# Patient Record
Sex: Male | Born: 1950
Health system: Southern US, Community
[De-identification: ages and names within clinical notes are randomized; demographics above are authoritative.]

## PROBLEM LIST (undated history)

## (undated) DIAGNOSIS — M65949 Unspecified synovitis and tenosynovitis, unspecified hand: Secondary | ICD-10-CM

## (undated) DIAGNOSIS — I499 Cardiac arrhythmia, unspecified: Secondary | ICD-10-CM

## (undated) DIAGNOSIS — F319 Bipolar disorder, unspecified: Secondary | ICD-10-CM

## (undated) DIAGNOSIS — R55 Syncope and collapse: Secondary | ICD-10-CM

## (undated) DIAGNOSIS — N400 Enlarged prostate without lower urinary tract symptoms: Secondary | ICD-10-CM

## (undated) DIAGNOSIS — M1990 Unspecified osteoarthritis, unspecified site: Secondary | ICD-10-CM

## (undated) DIAGNOSIS — F32A Depression, unspecified: Secondary | ICD-10-CM

## (undated) DIAGNOSIS — F419 Anxiety disorder, unspecified: Secondary | ICD-10-CM

## (undated) DIAGNOSIS — I480 Paroxysmal atrial fibrillation: Secondary | ICD-10-CM

## (undated) DIAGNOSIS — K219 Gastro-esophageal reflux disease without esophagitis: Secondary | ICD-10-CM

## (undated) DIAGNOSIS — E785 Hyperlipidemia, unspecified: Secondary | ICD-10-CM

## (undated) DIAGNOSIS — F329 Major depressive disorder, single episode, unspecified: Secondary | ICD-10-CM

## (undated) DIAGNOSIS — I739 Peripheral vascular disease, unspecified: Secondary | ICD-10-CM

## (undated) DIAGNOSIS — M659 Synovitis and tenosynovitis, unspecified: Secondary | ICD-10-CM

## (undated) HISTORY — PX: VASECTOMY: SHX75

## (undated) HISTORY — PX: TONSILLECTOMY: SUR1361

---
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
## 2014-07-09 ENCOUNTER — Emergency Department (HOSPITAL_COMMUNITY): Payer: 59

## 2014-07-09 ENCOUNTER — Encounter (HOSPITAL_COMMUNITY): Payer: Self-pay | Admitting: Neurology

## 2014-07-09 ENCOUNTER — Emergency Department (HOSPITAL_COMMUNITY)
Admission: EM | Admit: 2014-07-09 | Discharge: 2014-07-12 | Disposition: A | Discharge status: Other Acute Inpatient Hospital | Payer: 59 | Attending: Emergency Medicine | Admitting: Emergency Medicine

## 2014-07-09 DIAGNOSIS — F1310 Sedative, hypnotic or anxiolytic abuse, uncomplicated: Secondary | ICD-10-CM | POA: Insufficient documentation

## 2014-07-09 DIAGNOSIS — F32A Depression, unspecified: Secondary | ICD-10-CM

## 2014-07-09 DIAGNOSIS — F319 Bipolar disorder, unspecified: Secondary | ICD-10-CM | POA: Insufficient documentation

## 2014-07-09 DIAGNOSIS — N400 Enlarged prostate without lower urinary tract symptoms: Secondary | ICD-10-CM | POA: Insufficient documentation

## 2014-07-09 DIAGNOSIS — Z8739 Personal history of other diseases of the musculoskeletal system and connective tissue: Secondary | ICD-10-CM | POA: Diagnosis not present

## 2014-07-09 DIAGNOSIS — F329 Major depressive disorder, single episode, unspecified: Secondary | ICD-10-CM | POA: Diagnosis not present

## 2014-07-09 DIAGNOSIS — Z79899 Other long term (current) drug therapy: Secondary | ICD-10-CM | POA: Diagnosis not present

## 2014-07-09 DIAGNOSIS — R443 Hallucinations, unspecified: Secondary | ICD-10-CM | POA: Diagnosis not present

## 2014-07-09 DIAGNOSIS — F22 Delusional disorders: Secondary | ICD-10-CM | POA: Diagnosis not present

## 2014-07-09 DIAGNOSIS — R45851 Suicidal ideations: Secondary | ICD-10-CM | POA: Diagnosis not present

## 2014-07-09 HISTORY — DX: Bipolar disorder, unspecified: F31.9

## 2014-07-09 HISTORY — DX: Anxiety disorder, unspecified: F41.9

## 2014-07-09 HISTORY — DX: Depression, unspecified: F32.A

## 2014-07-09 HISTORY — DX: Major depressive disorder, single episode, unspecified: F32.9

## 2014-07-09 HISTORY — DX: Benign prostatic hyperplasia without lower urinary tract symptoms: N40.0

## 2014-07-09 HISTORY — DX: Unspecified osteoarthritis, unspecified site: M19.90

## 2014-07-09 LAB — COMPREHENSIVE METABOLIC PANEL
ALBUMIN: 4.5 g/dL (ref 3.5–5.2)
ALT: 26 U/L (ref 0–53)
AST: 31 U/L (ref 0–37)
Alkaline Phosphatase: 93 U/L (ref 39–117)
Anion gap: 6 (ref 5–15)
BUN: 15 mg/dL (ref 6–23)
CALCIUM: 10.1 mg/dL (ref 8.4–10.5)
CO2: 31 mmol/L (ref 19–32)
CREATININE: 1.23 mg/dL (ref 0.50–1.35)
Chloride: 96 mmol/L (ref 96–112)
GFR calc Af Amer: 70 mL/min — ABNORMAL LOW (ref >90)
GFR calc non Af Amer: 60 mL/min — ABNORMAL LOW (ref >90)
Glucose, Bld: 99 mg/dL (ref 70–99)
Potassium: 3.7 mmol/L (ref 3.5–5.1)
Sodium: 133 mmol/L — ABNORMAL LOW (ref 135–145)
TOTAL PROTEIN: 7.5 g/dL (ref 6.0–8.3)
Total Bilirubin: 0.8 mg/dL (ref 0.3–1.2)

## 2014-07-09 LAB — ETHANOL

## 2014-07-09 LAB — CBC
HCT: 46 % (ref 39.0–52.0)
Hemoglobin: 16.5 g/dL (ref 13.0–17.0)
MCH: 29.4 pg (ref 26.0–34.0)
MCHC: 35.9 g/dL (ref 30.0–36.0)
MCV: 82 fL (ref 78.0–100.0)
Platelets: 285 10*3/uL (ref 150–400)
RBC: 5.61 MIL/uL (ref 4.22–5.81)
RDW: 12.6 % (ref 11.5–15.5)
WBC: 8.3 10*3/uL (ref 4.0–10.5)

## 2014-07-09 LAB — SALICYLATE LEVEL: Salicylate Lvl: <4 mg/dL (ref 2.8–20.0)

## 2014-07-09 LAB — ACETAMINOPHEN LEVEL

## 2014-07-09 LAB — VALPROIC ACID LEVEL

## 2014-07-09 NOTE — ED Notes (Signed)
Pt reports depression due to financial reasons. Denies SI or HI. Pt appears sad, withdrawn, speaking softly and staring off while avoiding eye contact. Wife at bedside with patient. She reports last year he was dx with bipolar and spent 5 days at [facility]. Past few days hasn't been sleeping. Pt reports he is hearing voices. Wife reports the voices told him "I'm gonna take you down this time".

## 2014-07-09 NOTE — ED Notes (Signed)
Pt refuses to talk, pt laying in bed staring off into distance, while questioning patient if he is having SI he only replies with "depressed" and then the word "agony" Pt refuses to talk at this time, offered meal, pt refuses to eat.

## 2014-07-09 NOTE — ED Notes (Signed)
Attempted to in and out cath pt, pt diaper wet, no urine back.

## 2014-07-09 NOTE — ED Notes (Signed)
Pt called with no answer

## 2014-07-09 NOTE — ED Provider Notes (Signed)
CSN: [id number]     Arrival date & time 07/09/14  1736 History   None    Chief Complaint  Patient presents with  . Depression     (Consider location/radiation/quality/duration/timing/severity/associated sxs/prior Treatment) The history is provided by the patient. No language interpreter was used.  Juan Shepard is a 64 y/o M with PMhx of bipolar disorder, arthritis, BPH, depression presenting to the ED with increased depression. Patient was brought in by wife regarding increased depression over the past couple of days. Patient reported that he has been feeling sad. When this provider tried to have an interview with the patient, patient would not respond. Patient refuses to talk. When asked single yes or no questions, patient response. Patient reported that he has history of depression, reported that his mother has history of depression. When asked if he is hearing voices or seeing things patient responds "yes" but does not go any further description. Reported that he has had decrease in appetite and poor sleeping. When asked if he is suicidal homicidal patient does not answer. Stated that he started Seroquel 4 days ago. Denied marijuana, heroine, cocaine, alcohol abuse. Denied headache, dizziness, fever, chills, sore throat, difficulty swallowing, pain with swallowing, chest pain, shortness of breath, difficulty breathing, weakness, numbness, tingling, abdominal pain, nausea, vomiting, diarrhea, blood in the stools, black tarry stools, difficulty with urinating, leg weakness, leg pain, fall, injury, back pain, neck pain, neck stiffness. PCP none  Past Medical History  Diagnosis Date  . Bipolar disorder   . Arthritis   . BPH (benign prostatic hyperplasia)   . Depression    History reviewed. No pertinent past surgical history. No family history on file. History  Substance Use Topics  . Smoking status: Never Smoker   . Smokeless tobacco: Not on file  . Alcohol Use: No    Review of  Systems  Constitutional: Negative for fever and chills.  Eyes: Negative for visual disturbance.  Respiratory: Negative for chest tightness and shortness of breath.   Cardiovascular: Negative for chest pain.  Gastrointestinal: Negative for nausea, vomiting, abdominal pain, diarrhea, constipation, blood in stool and anal bleeding.  Genitourinary: Negative for dysuria and decreased urine volume.  Musculoskeletal: Negative for back pain, neck pain and neck stiffness.  Neurological: Negative for dizziness, weakness, numbness and headaches.  Psychiatric/Behavioral: Positive for hallucinations. Negative for suicidal ideas.      Allergies  Review of patient's allergies indicates no known allergies.  Home Medications   Prior to Admission medications   Medication Sig Start Date End Date Taking? Authorizing Provider  divalproex (DEPAKOTE ER) 500 MG 24 hr tablet Take 500 mg by mouth at bedtime.   Yes Historical Provider, MD  finasteride (PROSCAR) 5 MG tablet Take 5 mg by mouth daily.   Yes Historical Provider, MD  QUEtiapine (SEROQUEL) 100 MG tablet Take 400 mg by mouth at bedtime.   Yes Historical Provider, MD  sertraline (ZOLOFT) 50 MG tablet Take 50 mg by mouth every morning.   Yes Historical Provider, MD  tamsulosin (FLOMAX) 0.4 MG CAPS capsule Take 0.4 mg by mouth daily after supper.   Yes Historical Provider, MD  temazepam (RESTORIL) 15 MG capsule Take 15 mg by mouth at bedtime as needed for sleep.   Yes Historical Provider, MD   BP 170/93 mmHg  Pulse 80  Temp(Src) 98.4 F (36.9 C) (Oral)  Resp 20  SpO2 100% Physical Exam  Constitutional: He is oriented to person, place, and time. He appears well-developed and well-nourished. No distress.  HENT:  Head: Normocephalic and atraumatic.  Mouth/Throat: Oropharynx is clear and moist. No oropharyngeal exudate.  Eyes: Conjunctivae and EOM are normal. Pupils are equal, round, and reactive to light. Right eye exhibits no discharge. Left eye  exhibits no discharge.  Neck: Normal range of motion. Neck supple. No tracheal deviation present.  Cardiovascular: Normal rate, regular rhythm and normal heart sounds.  Exam reveals no friction rub.   No murmur heard. Pulmonary/Chest: Effort normal and breath sounds normal. No respiratory distress. He has no wheezes. He has no rales.  Abdominal: Soft. Bowel sounds are normal. He exhibits no distension. There is no tenderness. There is no rebound and no guarding.  Musculoskeletal: Normal range of motion.  Full ROM to upper and lower extremities without difficulty noted, negative ataxia noted.  Lymphadenopathy:    He has no cervical adenopathy.  Neurological: He is alert and oriented to person, place, and time. No cranial nerve deficit. He exhibits normal muscle tone. Coordination normal.  Cranial nerves III-XII grossly intact Equal grip strength bilaterally Patient follows commands well Patient response to questions appropriately Negative facial droop Negative slurred speech Negative aphasia Tremulous   Skin: Skin is warm and dry. No rash noted. He is not diaphoretic. No erythema.  Psychiatric:  Flat affect  Poor eye contact  Soft spoken   Nursing note and vitals reviewed.   ED Course  Procedures (including critical care time)  Results for orders placed or performed during the hospital encounter of 07/09/14  Acetaminophen level  Result Value Ref Range   Acetaminophen (Tylenol), Serum <10.0 (L) 10 - 30 ug/mL  CBC  Result Value Ref Range   WBC 8.3 4.0 - 10.5 K/uL   RBC 5.61 4.22 - 5.81 MIL/uL   Hemoglobin 16.5 13.0 - 17.0 g/dL   HCT 46.0 39.0 - 52.0 %   MCV 82.0 78.0 - 100.0 fL   MCH 29.4 26.0 - 34.0 pg   MCHC 35.9 30.0 - 36.0 g/dL   RDW 12.6 11.5 - 15.5 %   Platelets 285 150 - 400 K/uL  Comprehensive metabolic panel  Result Value Ref Range   Sodium 133 (L) 135 - 145 mmol/L   Potassium 3.7 3.5 - 5.1 mmol/L   Chloride 96 96 - 112 mmol/L   CO2 31 19 - 32 mmol/L    Glucose, Bld 99 70 - 99 mg/dL   BUN 15 6 - 23 mg/dL   Creatinine, Ser 1.23 0.50 - 1.35 mg/dL   Calcium 10.1 8.4 - 10.5 mg/dL   Total Protein 7.5 6.0 - 8.3 g/dL   Albumin 4.5 3.5 - 5.2 g/dL   AST 31 0 - 37 U/L   ALT 26 0 - 53 U/L   Alkaline Phosphatase 93 39 - 117 U/L   Total Bilirubin 0.8 0.3 - 1.2 mg/dL   GFR calc non Af Amer 60 (L) >90 mL/min   GFR calc Af Amer 70 (L) >90 mL/min   Anion gap 6 5 - 15  Ethanol (ETOH)  Result Value Ref Range   Alcohol, Ethyl (B) <5 0 - 9 mg/dL  Salicylate level  Result Value Ref Range   Salicylate Lvl <5.4 2.8 - 20.0 mg/dL  Valproic acid level  Result Value Ref Range   Valproic Acid Lvl <10.0 (L) 50.0 - 100.0 ug/mL    Labs Review Labs Reviewed  ACETAMINOPHEN LEVEL - Abnormal; Notable for the following:    Acetaminophen (Tylenol), Serum <10.0 (*)    All other components within normal limits  COMPREHENSIVE  METABOLIC PANEL - Abnormal; Notable for the following:    Sodium 133 (*)    GFR calc non Af Amer 60 (*)    GFR calc Af Amer 70 (*)    All other components within normal limits  VALPROIC ACID LEVEL - Abnormal; Notable for the following:    Valproic Acid Lvl <10.0 (*)    All other components within normal limits  CBC  ETHANOL  SALICYLATE LEVEL  URINE RAPID DRUG SCREEN (HOSP PERFORMED)  URINALYSIS, ROUTINE W REFLEX MICROSCOPIC    Imaging Review Ct Head Wo Contrast  07/09/2014   CLINICAL DATA:  Depression.  EXAM: CT HEAD WITHOUT CONTRAST  TECHNIQUE: Contiguous axial images were obtained from the base of the skull through the vertex without intravenous contrast.  COMPARISON:  None.  FINDINGS: No intracranial hemorrhage, mass effect, or midline shift. No hydrocephalus. The basilar cisterns are patent. No evidence of territorial infarct. No intracranial fluid collection. Calvarium is intact. There is mucosal thickening involving the maxillary sinuses, to a lesser extent ethmoid air cells and frontal sinuses. No fluid levels. The mastoid air  cells are well aerated.  IMPRESSION: 1.  No acute intracranial abnormality. 2. Mild paranasal sinus disease.   Electronically Signed   By: Jeb Levering M.D.   On: 07/09/2014 23:46     EKG Interpretation None      10:13 PM Dr. Regenia Skeeter, attending physician, at bedside assessing patient.  10:38 PM Discussed with attending physician. As per physician, recommended CT head without contrast to be performed. If negative, patient can be placed on TTS - medically cleared at that point.   MDM   Final diagnoses:  Depression    Medications - No data to display  Filed Vitals:   07/09/14 1827  BP: 170/93  Pulse: 80  Temp: 98.4 F (36.9 C)  TempSrc: Oral  Resp: 20  SpO2: 100%   CBC unremarkable -negative elevated leukocytosis. Hemoglobin 16.5, hematocrit 46.0. CMP unremarkable. Ethanol, salicylate, acetaminophen negative elevation. Valproic acid less than 10 - negative findings of Depakote toxicity. CT head no acute intracranial abnormalities identified. Urine pending. Patient presenting to the ED with intense case of depression, appears to be catatonic. Very flat effects, minimal speaking. Patient has history of depression. Patient seen and assessed by attending physician, Dr. Regenia Skeeter, agrees for psych orders to be placed and patient medically cleared. Patient stable, afebrile. Patient not septic appearing. Active signs respiratory distress. Patient medically cleared and psych holding orders have been placed. Discussed case with Dr. Dina Rich, overnight physician. Transfer of care at change in shift.   [employer], PA-C 07/10/14 1761  Sherwood Gambler, MD 07/14/14 [phone]

## 2014-07-09 NOTE — ED Notes (Signed)
Staffing made aware need for sitter.  

## 2014-07-10 ENCOUNTER — Encounter (HOSPITAL_COMMUNITY): Payer: Self-pay | Admitting: *Deleted

## 2014-07-10 LAB — URINALYSIS, ROUTINE W REFLEX MICROSCOPIC
Bilirubin Urine: NEGATIVE
GLUCOSE, UA: NEGATIVE mg/dL
Hgb urine dipstick: NEGATIVE
Ketones, ur: 40 mg/dL — AB
LEUKOCYTES UA: NEGATIVE
Nitrite: NEGATIVE
PH: 8.5 — AB (ref 5.0–8.0)
Protein, ur: NEGATIVE mg/dL
SPECIFIC GRAVITY, URINE: 1.016 (ref 1.005–1.030)
Urobilinogen, UA: 1 mg/dL (ref 0.0–1.0)

## 2014-07-10 LAB — RAPID URINE DRUG SCREEN, HOSP PERFORMED
Amphetamines: NOT DETECTED
BENZODIAZEPINES: POSITIVE — AB
Barbiturates: NOT DETECTED
COCAINE: NOT DETECTED
Opiates: NOT DETECTED
TETRAHYDROCANNABINOL: NOT DETECTED

## 2014-07-10 MED ORDER — FINASTERIDE 5 MG PO TABS
5.0000 mg | ORAL_TABLET | Freq: Every day | ORAL | Status: DC
Start: 2014-07-10 — End: 2014-07-12
  Administered 2014-07-10 – 2014-07-11 (×2): 5 mg via ORAL
  Filled 2014-07-10 (×4): qty 1

## 2014-07-10 MED ORDER — ONDANSETRON HCL 4 MG PO TABS
4.0000 mg | ORAL_TABLET | Freq: Three times a day (TID) | ORAL | Status: DC | PRN
Start: 2014-07-10 — End: 2014-07-12

## 2014-07-10 MED ORDER — QUETIAPINE FUMARATE 200 MG PO TABS
400.0000 mg | ORAL_TABLET | Freq: Every day | ORAL | Status: DC
  Administered 2014-07-10 – 2014-07-11 (×2): 400 mg via ORAL
  Filled 2014-07-10 (×2): qty 2

## 2014-07-10 MED ORDER — LORAZEPAM 1 MG PO TABS
1.0000 mg | ORAL_TABLET | Freq: Three times a day (TID) | ORAL | Status: DC | PRN
  Administered 2014-07-10: 1 mg via ORAL
  Filled 2014-07-10: qty 1

## 2014-07-10 MED ORDER — TEMAZEPAM 15 MG PO CAPS
15.0000 mg | ORAL_CAPSULE | Freq: Every evening | ORAL | Status: DC | PRN
  Administered 2014-07-10: 15 mg via ORAL
  Filled 2014-07-10: qty 1

## 2014-07-10 MED ORDER — DIVALPROEX SODIUM ER 500 MG PO TB24
500.0000 mg | ORAL_TABLET | Freq: Every day | ORAL | Status: DC
  Administered 2014-07-10 – 2014-07-11 (×2): 500 mg via ORAL
  Filled 2014-07-10 (×3): qty 1

## 2014-07-10 MED ORDER — SERTRALINE HCL 50 MG PO TABS
50.0000 mg | ORAL_TABLET | Freq: Every morning | ORAL | Status: DC
  Administered 2014-07-10 – 2014-07-11 (×2): 50 mg via ORAL
  Filled 2014-07-10 (×2): qty 1

## 2014-07-10 MED ORDER — TAMSULOSIN HCL 0.4 MG PO CAPS
0.4000 mg | ORAL_CAPSULE | Freq: Every day | ORAL | Status: DC
  Administered 2014-07-10 – 2014-07-11 (×2): 0.4 mg via ORAL
  Filled 2014-07-10 (×2): qty 1

## 2014-07-10 MED ORDER — ALUM & MAG HYDROXIDE-SIMETH 200-200-20 MG/5ML PO SUSP: 30.0000 mL | ORAL | Status: DC | PRN

## 2014-07-10 MED ORDER — ACETAMINOPHEN 325 MG PO TABS
650.0000 mg | ORAL_TABLET | Freq: Once | ORAL | Status: AC
  Administered 2014-07-10: 650 mg via ORAL

## 2014-07-10 MED ORDER — ACETAMINOPHEN 325 MG PO TABS
325.0000 mg | ORAL_TABLET | Freq: Once | ORAL | Status: DC
  Filled 2014-07-10: qty 1

## 2014-07-10 MED ORDER — IBUPROFEN 400 MG PO TABS: 600.0000 mg | ORAL_TABLET | Freq: Three times a day (TID) | ORAL | Status: DC | PRN

## 2014-07-10 NOTE — BH Assessment (Signed)
Tele Assessment Note   Juan Shepard is a 64 y.o. male who voluntarily presents to [facility], brought in by his wife.  Upon arrival to the emerg dept, per medical staff, pt was refusing to talk, lying in the bed staring at the ceiling.  Pt was catatonic, he was offered nourishment and refused.  The patient did engage with this writer, speaking softly, almost whispering.  This Probation officer observed the patient "skaking", when asked if he was cold, he replied he was hot, pt told this Probation officer he couldn't walk because his feet were numb and his hands--"I can't feel my hands or feet". Pt says he's been depressed x23mos and has been SI x3 mos with no plan or intent to harm himself.  Pt says his thoughts are triggered because he is a failure.  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  Pt says he is hearing voices saying "kill them" and has been hearing voices x 2 days.  He says the devil told him "I'm going to take you down big boy". pt says he is having a spiritual battle.    Pt reports 1 previous SI attempt in 1985, he attempted SI by leaning over a .22 gauge rifle, he heard his wife coming and decided not go through with it.  Pt endorses poor sleep for the past 3 mos, only sleeping 2hrs a day and poor appetite, wt loss of 10pds.  Pt says he is seeing outpatient provider, Dr. Margot Chimes) and is complaint with his medications but they are not working.     Axis I: Bipolar I disorder, Current or most recent episode depressed, With psychotic features Axis II: Deferred Axis III:  Past Medical History  Diagnosis Date  . Bipolar disorder   . Arthritis   . BPH (benign prostatic hyperplasia)   . Depression   . Anxiety    Axis IV: other psychosocial or environmental problems and problems related to social environment Axis V: 21-30 behavior considerably influenced by delusions or hallucinations OR serious impairment in judgment,  communication OR inability to function in almost all areas  Past Medical History:  Past Medical History  Diagnosis Date  . Bipolar disorder   . Arthritis   . BPH (benign prostatic hyperplasia)   . Depression   . Anxiety     History reviewed. No pertinent past surgical history.  Family History: No family history on file.  Social History:  reports that he has never smoked. He does not have any smokeless tobacco history on file. He reports that he does not drink alcohol or use illicit drugs.  Additional Social History:  Alcohol / Drug Use Pain Medications: See MAR  Prescriptions: See MAR  Over the Counter: See MAR  History of alcohol / drug use?: No history of alcohol / drug abuse Longest period of sobriety (when/how long): None   CIWA: CIWA-Ar BP: 170/93 mmHg Pulse Rate: 80 COWS:    PATIENT STRENGTHS: (choose at least two) Supportive family/friends  Allergies: No Known Allergies  Home Medications:  (Not in a hospital admission)  OB/GYN Status:  No LMP for male patient.  General Assessment Data Location of Assessment: [facility] ED Is this a Tele or Face-to-Face Assessment?: Tele Assessment Is this an Initial Assessment or a Re-assessment for this encounter?: Initial Assessment Living Arrangements: Spouse/significant other (Lives with spouse ) Can pt return to current living arrangement?: Yes Admission Status: Voluntary Is patient capable  of signing voluntary admission?: Yes Transfer from: Home Referral Source: Self/Family/Friend  Medical Screening Exam ([place]) Medical Exam completed: No Reason for MSE not completed: Other: Verlin Fester )  [facility] Crisis Care Plan Living Arrangements: Spouse/significant other (Lives with spouse ) Name of Psychiatrist: Dr Margot Chimes  Name of Therapist: None   Education Status Is patient currently in school?: No Current Grade: None  Highest grade of school patient has completed: None  Name of school: None  Contact person: None    Risk to self with the past 6 months Suicidal Ideation: Yes-Currently Present Suicidal Intent: No-Not Currently/Within Last 6 Months Is patient at risk for suicide?: Yes Suicidal Plan?: No-Not Currently/Within Last 6 Months Access to Means: No What has been your use of drugs/alcohol within the last 12 months?: Pt denies  Previous Attempts/Gestures: Yes How many times?: 1 Other Self Harm Risks: None  Triggers for Past Attempts: Unpredictable Intentional Self Injurious Behavior: None Family Suicide History: No Recent stressful life event(s): Turmoil (Comment) Persecutory voices/beliefs?: Yes Depression: Yes Depression Symptoms: Loss of interest in usual pleasures, Feeling worthless/self pity, Fatigue Substance abuse history and/or treatment for substance abuse?: No Suicide prevention information given to non-admitted patients: Not applicable  Risk to Others within the past 6 months Homicidal Ideation: No Thoughts of Harm to Others: No Current Homicidal Intent: No Current Homicidal Plan: No Access to Homicidal Means: No Identified Victim: None  History of harm to others?: No Assessment of Violence: None Noted Violent Behavior Description: None  Does patient have access to weapons?: No Criminal Charges Pending?: No Does patient have a court date: No  Psychosis Hallucinations: Auditory Delusions: None noted  Mental Status Report Appearance/Hygiene: In scrubs Eye Contact: Poor Motor Activity: Tremors Speech: Soft, Slow Level of Consciousness: Alert Mood: Depressed, Despair, Sad, Preoccupied Affect: Depressed, Sad, Preoccupied, Flat Anxiety Level: Minimal Thought Processes: Relevant, Coherent Judgement: Impaired Orientation: Person, Place, Time, Situation Obsessive Compulsive Thoughts/Behaviors: None  Cognitive Functioning Concentration: Normal Memory: Recent Intact, Remote Intact IQ: Average Insight: Poor Impulse Control: Fair Appetite: Poor Weight Loss:  10 Weight Gain: 0 Sleep: Decreased Total Hours of Sleep: 2 Vegetative Symptoms: None  ADLScreening [facility] Assessment Services) Patient's cognitive ability adequate to safely complete daily activities?: Yes Patient able to express need for assistance with ADLs?: Yes Independently performs ADLs?: Yes (appropriate for developmental age)  Prior Inpatient Therapy Prior Inpatient Therapy: No Prior Therapy Dates: None  Prior Therapy Facilty/Provider(s): None  Reason for Treatment: None   Prior Outpatient Therapy Prior Outpatient Therapy: Yes Prior Therapy Dates: Current  Prior Therapy Facilty/Provider(s): Dr. Margot Chimes  Reason for Treatment: Med Mgt   ADL Screening (condition at time of admission) Patient's cognitive ability adequate to safely complete daily activities?: Yes Is the patient deaf or have difficulty hearing?: No Does the patient have difficulty seeing, even when wearing glasses/contacts?: No Does the patient have difficulty concentrating, remembering, or making decisions?: Yes Patient able to express need for assistance with ADLs?: Yes Does the patient have difficulty dressing or bathing?: No Independently performs ADLs?: Yes (appropriate for developmental age) Does the patient have difficulty walking or climbing stairs?: No Weakness of Legs: None Weakness of Arms/Hands: None  Home Assistive Devices/Equipment Home Assistive Devices/Equipment: None  Therapy Consults (therapy consults require a physician order) PT Evaluation Needed: No OT Evalulation Needed: No SLP Evaluation Needed: No Abuse/Neglect Assessment (Assessment to be complete while patient is alone) Physical Abuse: Denies Verbal Abuse: Denies Sexual Abuse: Denies Exploitation of patient/patient's resources: Denies Self-Neglect: Denies Values / Beliefs  Cultural Requests During Hospitalization: None Spiritual Requests During Hospitalization: None Consults Spiritual Care Consult Needed: No Social  Work Consult Needed: No Regulatory affairs officer (For Healthcare) Does patient have an advance directive?: No Would patient like information on creating an advanced directive?: No - patient declined information    Additional Information 1:1 In Past 12 Months?: No CIRT Risk: No Elopement Risk: No Does patient have medical clearance?: Yes     Disposition:  Disposition Initial Assessment Completed for this Encounter: Yes Disposition of Patient: Inpatient treatment program Arlester Marker, NP recommend inpt admission ) Type of inpatient treatment program: Adult Arlester Marker, NP recommend inpt admission)  Girtha Rm 07/10/2014 5:06 AM

## 2014-07-10 NOTE — Progress Notes (Signed)
CSW sought placement at the following facilities: [place], fax referral [facility], fax referral [facility], fax referral [facility]- Christy Sartorius, fax referral (waitlist) [facility]- [facility], fax referral [facility], fax referral  [place], no gero beds 3/25 Malena Edman, at [employer], at [place]- left [facility], no gero/adult beds  Sharren Bridge, MSW, [place] Work, Disposition Office 07/10/2014

## 2014-07-11 ENCOUNTER — Encounter (HOSPITAL_COMMUNITY): Payer: Self-pay | Admitting: *Deleted

## 2014-07-11 ENCOUNTER — Emergency Department (HOSPITAL_COMMUNITY): Payer: 59

## 2014-07-11 DIAGNOSIS — R443 Hallucinations, unspecified: Secondary | ICD-10-CM

## 2014-07-11 DIAGNOSIS — F32A Depression, unspecified: Secondary | ICD-10-CM | POA: Insufficient documentation

## 2014-07-11 DIAGNOSIS — F22 Delusional disorders: Secondary | ICD-10-CM | POA: Diagnosis not present

## 2014-07-11 DIAGNOSIS — F329 Major depressive disorder, single episode, unspecified: Secondary | ICD-10-CM | POA: Diagnosis not present

## 2014-07-11 DIAGNOSIS — R45851 Suicidal ideations: Secondary | ICD-10-CM

## 2014-07-11 LAB — CBG MONITORING, ED: Glucose-Capillary: 110 mg/dL — ABNORMAL HIGH (ref 70–99)

## 2014-07-11 NOTE — ED Notes (Signed)
[facility] requesting chest x-ray and EKG to be performed - considering accepting pt.

## 2014-07-11 NOTE — ED Notes (Signed)
visitors at bedside

## 2014-07-11 NOTE — Consult Note (Signed)
Telepsych Consultation   Reason for Consult:  Psychosis Referring Physician:  EDP Patient Identification: Juan Shepard MRN:  [id number] Principal Diagnosis: <principal problem not specified> Diagnosis:   Patient Active Problem List   Diagnosis Date Noted  . Depression [F32.9]     Total Time spent with patient: 25 minutes  Subjective:   Juan Shepard is a 64 y.o. male patient admitted with reports of delusions and hallucinations about the devil and suicidal ideation with intent. Pt seen and chart reviewed. Pt reports that he is "being punished by the devil. I did every murder and every rape in the world. That's why... I did all of that". Pt is delusional and clearly psychotic. Nursing staff report that he would stare into space and refused to speak to them. During this assessment, pt appeared to be responding to internal stimuli. Pt reports that he would like help and that he is worried about his condition. He reports suicidal thoughts yet denies HI.   HPI:  Juan Shepard is a 64 y.o. male who voluntarily presents to [facility], brought in by his wife. Upon arrival to the emerg dept, per medical staff, pt was refusing to talk, lying in the bed staring at the ceiling. Pt was catatonic, he was offered nourishment and refused. The patient did engage with this writer, speaking softly, almost whispering. This Probation officer observed the patient "skaking", when asked if he was cold, he replied he was hot, pt told this Probation officer he couldn't walk because his feet were numb and his hands--"I can't feel my hands or feet". Pt says he's been depressed x41mos and has been SI x3 mos with no plan or intent to harm himself. Pt says his thoughts are triggered because he is a failure. 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 Pt says he is hearing voices saying "kill them" and has been hearing voices x 2 days. He says the devil told him  "I'm going to take you down big boy". pt says he is having a spiritual battle.   Pt reports 1 previous SI attempt in 1985, he attempted SI by leaning over a .22 gauge rifle, he heard his wife coming and decided not go through with it. Pt endorses poor sleep for the past 3 mos, only sleeping 2hrs a day and poor appetite, wt loss of 10pds. Pt says he is seeing outpatient provider, Dr. Margot Chimes) and is complaint with his medications but they are not working.   HPI Elements:   Location:  Psychiatric. Quality:  Worsening. Severity:  Severe. Timing:  Constant. Duration:  Intermittent. Context:  Exacerbation of underlying history of mental illness with unknown trigger.  Past Medical History:  Past Medical History  Diagnosis Date  . Bipolar disorder   . Arthritis   . BPH (benign prostatic hyperplasia)   . Depression   . Anxiety     Past Surgical History  Procedure Laterality Date  . Tonsillectomy     Family History: No family history on file. Social History:  History  Alcohol Use  . Yes    Comment: occ glass of wine     History  Drug Use No    History   Social History  . Marital Status: Married    Spouse Name: N/A  . Number of Children: N/A  . Years of Education: N/A   Social History Main Topics  . Smoking status: Never Smoker   . Smokeless  tobacco: Never Used  . Alcohol Use: Yes     Comment: occ glass of wine  . Drug Use: No  . Sexual Activity: Not on file   Other Topics Concern  . None   Social History Narrative   Additional Social History:    Pain Medications: See MAR  Prescriptions: See MAR  Over the Counter: See MAR  History of alcohol / drug use?: No history of alcohol / drug abuse Longest period of sobriety (when/how long): None                      Allergies:  No Known Allergies  Labs:  Results for orders placed or performed during the hospital encounter of 07/09/14 (from the past 48 hour(s))  Acetaminophen level     Status:  Abnormal   Collection Time: 07/09/14  6:43 PM  Result Value Ref Range   Acetaminophen (Tylenol), Serum <10.0 (L) 10 - 30 ug/mL    Comment:        THERAPEUTIC CONCENTRATIONS VARY SIGNIFICANTLY. A RANGE OF 10-30 ug/mL MAY BE AN EFFECTIVE CONCENTRATION FOR MANY PATIENTS. HOWEVER, SOME ARE BEST TREATED AT CONCENTRATIONS OUTSIDE THIS RANGE. ACETAMINOPHEN CONCENTRATIONS >150 ug/mL AT 4 HOURS AFTER INGESTION AND >50 ug/mL AT 12 HOURS AFTER INGESTION ARE OFTEN ASSOCIATED WITH TOXIC REACTIONS.   CBC     Status: None   Collection Time: 07/09/14  6:43 PM  Result Value Ref Range   WBC 8.3 4.0 - 10.5 K/uL   RBC 5.61 4.22 - 5.81 MIL/uL   Hemoglobin 16.5 13.0 - 17.0 g/dL   HCT 46.0 39.0 - 52.0 %   MCV 82.0 78.0 - 100.0 fL   MCH 29.4 26.0 - 34.0 pg   MCHC 35.9 30.0 - 36.0 g/dL   RDW 12.6 11.5 - 15.5 %   Platelets 285 150 - 400 K/uL  Comprehensive metabolic panel     Status: Abnormal   Collection Time: 07/09/14  6:43 PM  Result Value Ref Range   Sodium 133 (L) 135 - 145 mmol/L   Potassium 3.7 3.5 - 5.1 mmol/L   Chloride 96 96 - 112 mmol/L   CO2 31 19 - 32 mmol/L   Glucose, Bld 99 70 - 99 mg/dL   BUN 15 6 - 23 mg/dL   Creatinine, Ser 1.23 0.50 - 1.35 mg/dL   Calcium 10.1 8.4 - 10.5 mg/dL   Total Protein 7.5 6.0 - 8.3 g/dL   Albumin 4.5 3.5 - 5.2 g/dL   AST 31 0 - 37 U/L   ALT 26 0 - 53 U/L   Alkaline Phosphatase 93 39 - 117 U/L   Total Bilirubin 0.8 0.3 - 1.2 mg/dL   GFR calc non Af Amer 60 (L) >90 mL/min   GFR calc Af Amer 70 (L) >90 mL/min    Comment: (NOTE) The eGFR has been calculated using the CKD EPI equation. This calculation has not been validated in all clinical situations. eGFR's persistently <90 mL/min signify possible Chronic Kidney Disease.    Anion gap 6 5 - 15  Ethanol (ETOH)     Status: None   Collection Time: 07/09/14  6:43 PM  Result Value Ref Range   Alcohol, Ethyl (B) <5 0 - 9 mg/dL    Comment:        LOWEST DETECTABLE LIMIT FOR SERUM ALCOHOL IS 11  mg/dL FOR MEDICAL PURPOSES ONLY   Salicylate level     Status: None   Collection Time: 07/09/14  6:43 PM  Result Value Ref Range   Salicylate Lvl <5.0 2.8 - 20.0 mg/dL  Valproic acid level     Status: Abnormal   Collection Time: 07/09/14  6:43 PM  Result Value Ref Range   Valproic Acid Lvl <10.0 (L) 50.0 - 100.0 ug/mL    Comment: RESULTS CONFIRMED BY MANUAL DILUTION  Urine Drug Screen     Status: Abnormal   Collection Time: 07/10/14  1:56 AM  Result Value Ref Range   Opiates NONE DETECTED NONE DETECTED   Cocaine NONE DETECTED NONE DETECTED   Benzodiazepines POSITIVE (A) NONE DETECTED   Amphetamines NONE DETECTED NONE DETECTED   Tetrahydrocannabinol NONE DETECTED NONE DETECTED   Barbiturates NONE DETECTED NONE DETECTED    Comment:        DRUG SCREEN FOR MEDICAL PURPOSES ONLY.  IF CONFIRMATION IS NEEDED FOR ANY PURPOSE, NOTIFY LAB WITHIN 5 DAYS.        LOWEST DETECTABLE LIMITS FOR URINE DRUG SCREEN Drug Class       Cutoff (ng/mL) Amphetamine      1000 Barbiturate      200 Benzodiazepine   539 Tricyclics       767 Opiates          300 Cocaine          300 THC              50   Urinalysis, Routine w reflex microscopic     Status: Abnormal   Collection Time: 07/10/14  2:00 AM  Result Value Ref Range   Color, Urine YELLOW YELLOW   APPearance CLEAR CLEAR   Specific Gravity, Urine 1.016 1.005 - 1.030   pH 8.5 (H) 5.0 - 8.0   Glucose, UA NEGATIVE NEGATIVE mg/dL   Hgb urine dipstick NEGATIVE NEGATIVE   Bilirubin Urine NEGATIVE NEGATIVE   Ketones, ur 40 (A) NEGATIVE mg/dL   Protein, ur NEGATIVE NEGATIVE mg/dL   Urobilinogen, UA 1.0 0.0 - 1.0 mg/dL   Nitrite NEGATIVE NEGATIVE   Leukocytes, UA NEGATIVE NEGATIVE    Comment: MICROSCOPIC NOT DONE ON URINES WITH NEGATIVE PROTEIN, BLOOD, LEUKOCYTES, NITRITE, OR GLUCOSE <1000 mg/dL.    Vitals: Blood pressure 100/73, pulse 67, temperature 98.2 F (36.8 C), temperature source Oral, resp. rate 18, SpO2 99 %.  Risk to Self:  Suicidal Ideation: Yes-Currently Present Suicidal Intent: No-Not Currently/Within Last 6 Months Is patient at risk for suicide?: Yes Suicidal Plan?: No-Not Currently/Within Last 6 Months Access to Means: No What has been your use of drugs/alcohol within the last 12 months?: Pt denies  How many times?: 1 Other Self Harm Risks: None  Triggers for Past Attempts: Unpredictable Intentional Self Injurious Behavior: None Risk to Others: Homicidal Ideation: No Thoughts of Harm to Others: No Current Homicidal Intent: No Current Homicidal Plan: No Access to Homicidal Means: No Identified Victim: None  History of harm to others?: No Assessment of Violence: None Noted Violent Behavior Description: None  Does patient have access to weapons?: No Criminal Charges Pending?: No Does patient have a court date: No Prior Inpatient Therapy: Prior Inpatient Therapy: No Prior Therapy Dates: None  Prior Therapy Facilty/Provider(s): None  Reason for Treatment: None  Prior Outpatient Therapy: Prior Outpatient Therapy: Yes Prior Therapy Dates: Current  Prior Therapy Facilty/Provider(s): Dr. Margot Chimes  Reason for Treatment: Med Mgt   Current Facility-Administered Medications  Medication Dose Route Frequency Provider Last Rate Last Dose  . alum & mag hydroxide-simeth (MAALOX/MYLANTA) 200-200-20 MG/5ML suspension 30 mL  30 mL Oral PRN Blanchie Dessert,  MD      . divalproex (DEPAKOTE ER) 24 hr tablet 500 mg  500 mg Oral QHS Merryl Hacker, MD   500 mg at 07/10/14 2153  . finasteride (PROSCAR) tablet 5 mg  5 mg Oral Daily Merryl Hacker, MD   5 mg at 07/11/14 0949  . ibuprofen (ADVIL,MOTRIN) tablet 600 mg  600 mg Oral Q8H PRN Blanchie Dessert, MD      . LORazepam (ATIVAN) tablet 1 mg  1 mg Oral Q8H PRN Blanchie Dessert, MD   1 mg at 07/10/14 0910  . ondansetron (ZOFRAN) tablet 4 mg  4 mg Oral Q8H PRN Blanchie Dessert, MD      . QUEtiapine (SEROQUEL) tablet 400 mg  400 mg Oral QHS Merryl Hacker, MD   400 mg at 07/10/14 2153  . sertraline (ZOLOFT) tablet 50 mg  50 mg Oral q morning - 10a Merryl Hacker, MD   50 mg at 07/11/14 0949  . tamsulosin (FLOMAX) capsule 0.4 mg  0.4 mg Oral QPC supper Merryl Hacker, MD   0.4 mg at 07/10/14 1807  . temazepam (RESTORIL) capsule 15 mg  15 mg Oral QHS PRN Merryl Hacker, MD   15 mg at 07/10/14 2309   Current Outpatient Prescriptions  Medication Sig Dispense Refill  . divalproex (DEPAKOTE ER) 500 MG 24 hr tablet Take 500 mg by mouth at bedtime.    . finasteride (PROSCAR) 5 MG tablet Take 5 mg by mouth daily.    . QUEtiapine (SEROQUEL) 100 MG tablet Take 400 mg by mouth at bedtime.    . sertraline (ZOLOFT) 50 MG tablet Take 50 mg by mouth every morning.    . tamsulosin (FLOMAX) 0.4 MG CAPS capsule Take 0.4 mg by mouth daily after supper.    . temazepam (RESTORIL) 15 MG capsule Take 15 mg by mouth at bedtime as needed for sleep.      Musculoskeletal: UTO, camera  Psychiatric Specialty Exam:     Blood pressure 100/73, pulse 67, temperature 98.2 F (36.8 C), temperature source Oral, resp. rate 18, SpO2 99 %.There is no height or weight on file to calculate BMI.  General Appearance: Disheveled and Guarded  Engineer, water::  Fair  Speech:  Slow  Volume:  Decreased  Mood:  Anxious  Affect:  Non-Congruent and Restricted  Thought Process:  Disorganized and Tangential  Orientation:  Full (Time, Place, and Person)  Thought Content:  Delusions and Hallucinations: Auditory Visual  Suicidal Thoughts:  Yes.  with intent/plan  Homicidal Thoughts:  No  Memory:  Immediate;   Fair Recent;   Fair Remote;   Fair  Judgement:  Fair  Insight:  Fair  Psychomotor Activity:  Normal  Concentration:  Good  Recall:  [employer] [place]  Language: Fair  Akathisia:  No  Handed:    AIMS (if indicated):     Assets:  Desire for Improvement Resilience  ADL's:  Intact  Cognition: WNL  Sleep:      Medical Decision Making: Review of  Psycho-Social Stressors (1), Review or order clinical lab tests (1) and Established Problem, Worsening (2)   Treatment Plan Summary: Daily contact with patient to assess and evaluate symptoms and progress in treatment  Plan:  Recommend psychiatric Inpatient admission when medically cleared.  Disposition:  -Seek inpatient psychiatric hospitalization for safety and stabilization.   Benjamine Mola, FNP-BC 07/11/2014 1:53PM      Case discussed with me as above

## 2014-07-11 NOTE — ED Notes (Addendum)
Pt has been accepted to [facility]. Attempted to get pt to sign consent for treatment and he was unable to sign, he only scribbled on the signature line. Spoke with Marijean Bravo at [facility] who states if pt's wife can present to the ED tonight with her POA form, we can have her sign his consent form. Spoke with pt's wife on the phone and she states she will try to come as soon as she can.

## 2014-07-11 NOTE — Progress Notes (Signed)
CSW faxed EKG and Chest Xray results to [facility], relayed to [facility] that the NP states the patient will not need IVC as he is interested in treatment.  Stanton Kidney will fax over a consent for treatment, and review labs as far as admission.  [facility] Lili Harts Richardo Priest ED CSW [phone]

## 2014-07-11 NOTE — Progress Notes (Signed)
CSW continued inpatient placement for patient.  CSW also followed up on previous referrals and obtained denials from previous referrals.   Faxed To:  [facility]: Mary (refaxed)  Rosana Hoes: Wynona D[place]: Janeann Merl: Tammy   Continues to Pend for Placement From Previous Referrals: [facility]: Have not reviewed [facility] (No answer)  Left Message: [facility] (No Answer)  Denied at Previous Referrals: [place] [place]: [place] (due to acuity)  At capacity: [place]  [place]  [place], [place] Disposition Social Worker [phone]

## 2014-07-11 NOTE — ED Notes (Signed)
Reassessed pt's ability to sign consent form and pt was able to sign. Faxed form to [facility] intake and they state it is adequate. Will proceed with transfer.

## 2014-07-11 NOTE — ED Notes (Signed)
TTS completed. 

## 2014-07-11 NOTE — Progress Notes (Signed)
CSW spoke to [place] at [facility]. The admitting MD is interested in patient, requesting EKG and Chest Xray.  Asking if patient will be IVC'd or not.  CSW will consult with medical staff about requests and thoughts concerning IVC.  [facility] Rollan Roger Richardo Priest ED CSW [phone]

## 2014-07-11 NOTE — BH Assessment (Signed)
Spoke to [place] at [facility] who said Dr. Tommye Standard has accepted Pt. Pt needs to sign voluntary consent for treatment form for [facility] and it must be faxed back to them at [phone]. Nursing report should be called to [phone]. Notified ED staff, faxed voluntary consent form to [facility] [facility].  Spoke to [employer], Therapist, sports who said Pt says he cannot physically sign form. Informed Dr. Debby Freiberg who said he would discuss situation with Pt and have Pt sign form or Pt will be considered for IVC.  Orpah Greek Rosana Hoes, [facility] Triage Specialist [phone]

## 2014-07-12 NOTE — ED Notes (Signed)
[facility] notified to transport pt to [facility].

## 2015-03-23 DIAGNOSIS — Z0000 Encounter for general adult medical examination without abnormal findings: Secondary | ICD-10-CM | POA: Diagnosis not present

## 2015-03-23 DIAGNOSIS — Z125 Encounter for screening for malignant neoplasm of prostate: Secondary | ICD-10-CM | POA: Diagnosis not present

## 2015-03-23 DIAGNOSIS — Z79899 Other long term (current) drug therapy: Secondary | ICD-10-CM | POA: Diagnosis not present

## 2015-03-23 DIAGNOSIS — I4891 Unspecified atrial fibrillation: Secondary | ICD-10-CM | POA: Diagnosis not present

## 2015-04-09 DIAGNOSIS — M19041 Primary osteoarthritis, right hand: Secondary | ICD-10-CM | POA: Diagnosis not present

## 2015-04-16 DIAGNOSIS — R2231 Localized swelling, mass and lump, right upper limb: Secondary | ICD-10-CM | POA: Diagnosis not present

## 2015-04-16 DIAGNOSIS — M19042 Primary osteoarthritis, left hand: Secondary | ICD-10-CM | POA: Diagnosis not present

## 2015-04-16 DIAGNOSIS — M19032 Primary osteoarthritis, left wrist: Secondary | ICD-10-CM | POA: Diagnosis not present

## 2015-04-16 DIAGNOSIS — M659 Synovitis and tenosynovitis, unspecified: Secondary | ICD-10-CM | POA: Diagnosis not present

## 2015-04-30 DIAGNOSIS — M659 Synovitis and tenosynovitis, unspecified: Secondary | ICD-10-CM | POA: Diagnosis not present

## 2015-05-04 DIAGNOSIS — M2550 Pain in unspecified joint: Secondary | ICD-10-CM | POA: Diagnosis not present

## 2015-05-12 DIAGNOSIS — R52 Pain, unspecified: Secondary | ICD-10-CM | POA: Diagnosis not present

## 2015-05-12 DIAGNOSIS — M65841 Other synovitis and tenosynovitis, right hand: Secondary | ICD-10-CM | POA: Diagnosis not present

## 2015-06-02 DIAGNOSIS — I480 Paroxysmal atrial fibrillation: Secondary | ICD-10-CM | POA: Diagnosis not present

## 2015-06-04 DIAGNOSIS — R52 Pain, unspecified: Secondary | ICD-10-CM | POA: Diagnosis not present

## 2015-06-04 DIAGNOSIS — M65332 Trigger finger, left middle finger: Secondary | ICD-10-CM | POA: Diagnosis not present

## 2015-07-02 ENCOUNTER — Other Ambulatory Visit: Payer: Self-pay | Admitting: Orthopedic Surgery

## 2015-07-02 ENCOUNTER — Encounter (HOSPITAL_BASED_OUTPATIENT_CLINIC_OR_DEPARTMENT_OTHER): Payer: Self-pay | Admitting: *Deleted

## 2015-07-02 DIAGNOSIS — M65842 Other synovitis and tenosynovitis, left hand: Secondary | ICD-10-CM | POA: Diagnosis not present

## 2015-07-06 ENCOUNTER — Ambulatory Visit (HOSPITAL_BASED_OUTPATIENT_CLINIC_OR_DEPARTMENT_OTHER): Admission: RE | Admit: 2015-07-06 | Payer: Medicare HMO | Source: Ambulatory Visit | Admitting: Orthopedic Surgery

## 2015-07-06 HISTORY — DX: Cardiac arrhythmia, unspecified: I49.9

## 2015-07-06 HISTORY — DX: Unspecified synovitis and tenosynovitis, unspecified hand: M65.949

## 2015-07-06 HISTORY — DX: Gastro-esophageal reflux disease without esophagitis: K21.9

## 2015-07-06 HISTORY — DX: Synovitis and tenosynovitis, unspecified: M65.9

## 2015-07-06 SURGERY — TENOSYNOVECTOMY
Anesthesia: Choice | Site: Finger | Laterality: Left

## 2015-07-09 ENCOUNTER — Encounter (HOSPITAL_BASED_OUTPATIENT_CLINIC_OR_DEPARTMENT_OTHER): Payer: Self-pay | Admitting: *Deleted

## 2015-07-09 ENCOUNTER — Other Ambulatory Visit: Payer: Self-pay | Admitting: Orthopedic Surgery

## 2015-07-15 ENCOUNTER — Encounter (HOSPITAL_BASED_OUTPATIENT_CLINIC_OR_DEPARTMENT_OTHER): Payer: Self-pay | Admitting: Orthopedic Surgery

## 2015-07-15 ENCOUNTER — Encounter (HOSPITAL_BASED_OUTPATIENT_CLINIC_OR_DEPARTMENT_OTHER)
Admission: RE | Disposition: A | Discharge status: Home / Self Care | Payer: Self-pay | Source: Ambulatory Visit | Attending: Orthopedic Surgery

## 2015-07-15 ENCOUNTER — Ambulatory Visit (HOSPITAL_BASED_OUTPATIENT_CLINIC_OR_DEPARTMENT_OTHER): Payer: Medicare HMO | Admitting: Certified Registered"

## 2015-07-15 ENCOUNTER — Ambulatory Visit (HOSPITAL_BASED_OUTPATIENT_CLINIC_OR_DEPARTMENT_OTHER)
Admission: RE | Admit: 2015-07-15 | Discharge: 2015-07-15 | Disposition: A | Discharge status: Home / Self Care | Payer: Medicare HMO | Source: Ambulatory Visit | Attending: Orthopedic Surgery | Admitting: Orthopedic Surgery

## 2015-07-15 DIAGNOSIS — M65332 Trigger finger, left middle finger: Secondary | ICD-10-CM | POA: Insufficient documentation

## 2015-07-15 DIAGNOSIS — Z79899 Other long term (current) drug therapy: Secondary | ICD-10-CM | POA: Insufficient documentation

## 2015-07-15 DIAGNOSIS — I480 Paroxysmal atrial fibrillation: Secondary | ICD-10-CM | POA: Diagnosis not present

## 2015-07-15 DIAGNOSIS — Z7982 Long term (current) use of aspirin: Secondary | ICD-10-CM | POA: Diagnosis not present

## 2015-07-15 DIAGNOSIS — F319 Bipolar disorder, unspecified: Secondary | ICD-10-CM | POA: Diagnosis not present

## 2015-07-15 DIAGNOSIS — R69 Illness, unspecified: Secondary | ICD-10-CM | POA: Diagnosis not present

## 2015-07-15 DIAGNOSIS — N400 Enlarged prostate without lower urinary tract symptoms: Secondary | ICD-10-CM | POA: Diagnosis not present

## 2015-07-15 DIAGNOSIS — K219 Gastro-esophageal reflux disease without esophagitis: Secondary | ICD-10-CM | POA: Insufficient documentation

## 2015-07-15 DIAGNOSIS — M65842 Other synovitis and tenosynovitis, left hand: Secondary | ICD-10-CM | POA: Diagnosis not present

## 2015-07-15 HISTORY — PX: TENOSYNOVECTOMY: SHX6110

## 2015-07-15 SURGERY — TENOSYNOVECTOMY
Anesthesia: General | Site: Finger | Laterality: Left

## 2015-07-15 MED ORDER — PROPOFOL 10 MG/ML IV BOLUS
INTRAVENOUS | Status: DC | PRN
  Administered 2015-07-15: 200 mg via INTRAVENOUS

## 2015-07-15 MED ORDER — ONDANSETRON HCL 4 MG/2ML IJ SOLN
INTRAMUSCULAR | Status: AC
  Filled 2015-07-15: qty 2

## 2015-07-15 MED ORDER — MIDAZOLAM HCL 2 MG/2ML IJ SOLN
INTRAMUSCULAR | Status: AC
Start: 2015-07-15 — End: 2015-07-15
  Filled 2015-07-15: qty 2

## 2015-07-15 MED ORDER — HYDROCODONE-ACETAMINOPHEN 10-325 MG PO TABS: 1.0000 | ORAL_TABLET | Freq: Four times a day (QID) | ORAL | Status: DC | PRN

## 2015-07-15 MED ORDER — DEXTROSE 5 % IV SOLN
2.0000 g | INTRAVENOUS | Status: AC
  Administered 2015-07-15: 2 g via INTRAVENOUS

## 2015-07-15 MED ORDER — FENTANYL CITRATE (PF) 100 MCG/2ML IJ SOLN
50.0000 ug | INTRAMUSCULAR | Status: DC | PRN
  Administered 2015-07-15: 100 ug via INTRAVENOUS

## 2015-07-15 MED ORDER — MIDAZOLAM HCL 2 MG/2ML IJ SOLN
1.0000 mg | INTRAMUSCULAR | Status: DC | PRN
Start: 2015-07-15 — End: 2015-07-15
  Administered 2015-07-15: 2 mg via INTRAVENOUS

## 2015-07-15 MED ORDER — LACTATED RINGERS IV SOLN
INTRAVENOUS | Status: DC
  Administered 2015-07-15: 12:00:00 via INTRAVENOUS

## 2015-07-15 MED ORDER — FENTANYL CITRATE (PF) 100 MCG/2ML IJ SOLN
INTRAMUSCULAR | Status: AC
  Filled 2015-07-15: qty 2

## 2015-07-15 MED ORDER — CEFAZOLIN SODIUM-DEXTROSE 2-4 GM/100ML-% IV SOLN
INTRAVENOUS | Status: AC
  Filled 2015-07-15: qty 100

## 2015-07-15 MED ORDER — SCOPOLAMINE 1 MG/3DAYS TD PT72: 1.0000 | MEDICATED_PATCH | Freq: Once | TRANSDERMAL | Status: DC | PRN

## 2015-07-15 MED ORDER — LIDOCAINE HCL (CARDIAC) 20 MG/ML IV SOLN
INTRAVENOUS | Status: DC | PRN
  Administered 2015-07-15: 50 mg via INTRAVENOUS

## 2015-07-15 MED ORDER — DEXAMETHASONE SODIUM PHOSPHATE 10 MG/ML IJ SOLN
INTRAMUSCULAR | Status: AC
  Filled 2015-07-15: qty 1

## 2015-07-15 MED ORDER — GLYCOPYRROLATE 0.2 MG/ML IJ SOLN: 0.2000 mg | Freq: Once | INTRAMUSCULAR | Status: DC | PRN

## 2015-07-15 MED ORDER — DEXAMETHASONE SODIUM PHOSPHATE 10 MG/ML IJ SOLN
INTRAMUSCULAR | Status: DC | PRN
  Administered 2015-07-15: 10 mg via INTRAVENOUS

## 2015-07-15 MED ORDER — LIDOCAINE HCL (CARDIAC) 20 MG/ML IV SOLN
INTRAVENOUS | Status: AC
  Filled 2015-07-15: qty 5

## 2015-07-15 MED ORDER — CHLORHEXIDINE GLUCONATE 4 % EX LIQD: 60.0000 mL | Freq: Once | CUTANEOUS | Status: DC

## 2015-07-15 MED ORDER — BUPIVACAINE HCL (PF) 0.25 % IJ SOLN
INTRAMUSCULAR | Status: DC | PRN
  Administered 2015-07-15: 9 mL

## 2015-07-15 MED ORDER — ONDANSETRON HCL 4 MG/2ML IJ SOLN
INTRAMUSCULAR | Status: DC | PRN
  Administered 2015-07-15: 4 mg via INTRAVENOUS

## 2015-07-15 SURGICAL SUPPLY — 52 items
BAG DECANTER FOR FLEXI CONT (MISCELLANEOUS) IMPLANT
BLADE MINI RND TIP GREEN BEAV (BLADE) IMPLANT
BLADE SURG 15 STRL LF DISP TIS (BLADE) ×1 IMPLANT
BLADE SURG 15 STRL SS (BLADE) ×1
BNDG COHESIVE 3X5 TAN STRL LF (GAUZE/BANDAGES/DRESSINGS) ×2 IMPLANT
BNDG ESMARK 4X9 LF (GAUZE/BANDAGES/DRESSINGS) ×2 IMPLANT
BNDG GAUZE ELAST 4 BULKY (GAUZE/BANDAGES/DRESSINGS) ×2 IMPLANT
CHLORAPREP W/TINT 26ML (MISCELLANEOUS) ×2 IMPLANT
CORDS BIPOLAR (ELECTRODE) ×2 IMPLANT
COVER BACK TABLE 60X90IN (DRAPES) ×2 IMPLANT
COVER MAYO STAND STRL (DRAPES) ×2 IMPLANT
CUFF TOURNIQUET SINGLE 18IN (TOURNIQUET CUFF) ×2 IMPLANT
DECANTER SPIKE VIAL GLASS SM (MISCELLANEOUS) IMPLANT
DRAPE EXTREMITY T 121X128X90 (DRAPE) ×2 IMPLANT
DRAPE SURG 17X23 STRL (DRAPES) ×2 IMPLANT
GAUZE SPONGE 4X4 12PLY STRL (GAUZE/BANDAGES/DRESSINGS) ×2 IMPLANT
GAUZE XEROFORM 1X8 LF (GAUZE/BANDAGES/DRESSINGS) ×2 IMPLANT
GLOVE BIO SURGEON STRL SZ 6.5 (GLOVE) ×2 IMPLANT
GLOVE BIOGEL PI IND STRL 7.0 (GLOVE) ×1 IMPLANT
GLOVE BIOGEL PI IND STRL 8.5 (GLOVE) ×1 IMPLANT
GLOVE BIOGEL PI INDICATOR 7.0 (GLOVE) ×1
GLOVE BIOGEL PI INDICATOR 8.5 (GLOVE) ×1
GLOVE SURG ORTHO 8.0 STRL STRW (GLOVE) ×2 IMPLANT
GOWN STRL REUS W/ TWL LRG LVL3 (GOWN DISPOSABLE) ×1 IMPLANT
GOWN STRL REUS W/TWL LRG LVL3 (GOWN DISPOSABLE) ×1
GOWN STRL REUS W/TWL XL LVL3 (GOWN DISPOSABLE) ×2 IMPLANT
LOOP VESSEL MAXI BLUE (MISCELLANEOUS) IMPLANT
NEEDLE KEITH (NEEDLE) IMPLANT
NEEDLE PRECISIONGLIDE 27X1.5 (NEEDLE) ×2 IMPLANT
NS IRRIG 1000ML POUR BTL (IV SOLUTION) ×2 IMPLANT
PACK BASIN DAY SURGERY FS (CUSTOM PROCEDURE TRAY) ×2 IMPLANT
PAD CAST 3X4 CTTN HI CHSV (CAST SUPPLIES) ×1 IMPLANT
PADDING CAST ABS 4INX4YD NS (CAST SUPPLIES)
PADDING CAST ABS COTTON 4X4 ST (CAST SUPPLIES) IMPLANT
PADDING CAST COTTON 3X4 STRL (CAST SUPPLIES) ×1
SLEEVE SCD COMPRESS KNEE MED (MISCELLANEOUS) IMPLANT
SPLINT PLASTER CAST XFAST 3X15 (CAST SUPPLIES) IMPLANT
SPLINT PLASTER XTRA FASTSET 3X (CAST SUPPLIES)
STOCKINETTE 4X48 STRL (DRAPES) ×2 IMPLANT
SUT ETHILON 4 0 PS 2 18 (SUTURE) ×2 IMPLANT
SUT MERSILENE 4 0 P 3 (SUTURE) IMPLANT
SUT SILK 4 0 PS 2 (SUTURE) IMPLANT
SUT VIC AB 4-0 P-3 18XBRD (SUTURE) IMPLANT
SUT VIC AB 4-0 P3 18 (SUTURE)
SUT VICRYL 4-0 PS2 18IN ABS (SUTURE) IMPLANT
SWAB COLLECTION DEVICE MRSA (MISCELLANEOUS) ×2 IMPLANT
SWAB CULTURE ESWAB REG 1ML (MISCELLANEOUS) ×2 IMPLANT
SYR BULB 3OZ (MISCELLANEOUS) ×2 IMPLANT
SYR CONTROL 10ML LL (SYRINGE) ×2 IMPLANT
TOWEL OR 17X24 6PK STRL BLUE (TOWEL DISPOSABLE) ×4 IMPLANT
TUBE FEEDING 5FR 15 INCH (TUBING) IMPLANT
UNDERPAD 30X30 (UNDERPADS AND DIAPERS) ×2 IMPLANT

## 2015-07-15 NOTE — Anesthesia Postprocedure Evaluation (Signed)
Anesthesia Post Note  Patient: Juan Shepard  Procedure(s) Performed: Procedure(s) (LRB): TENOSYNOVECTOMY LEFT FLEXORS LEFT MIDDLE FINGER (Left)  Patient location during evaluation: PACU Anesthesia Type: General Level of consciousness: awake and alert and patient cooperative Pain management: pain level controlled Vital Signs Assessment: post-procedure vital signs reviewed and stable Respiratory status: spontaneous breathing and respiratory function stable Cardiovascular status: stable Anesthetic complications: no    Last Vitals:  Filed Vitals:   07/15/15 1345 07/15/15 1400  BP: 107/68 124/76  Pulse: 61 63  Temp:    Resp: 6 7    Last Pain:  Filed Vitals:   07/15/15 1408  PainSc: 0-No pain                 Phelicia Dantes S

## 2015-07-15 NOTE — Op Note (Signed)
NAMEMarland Kitchen  DEANDRE, BALLIN NO.:  [id number]  MEDICAL RECORD NO.:  [id number]  LOCATION:                                 FACILITY:  PHYSICIAN:  Daryll Brod, M.D.            DATE OF BIRTH:  DATE OF PROCEDURE:  07/15/2015 DATE OF DISCHARGE:                              OPERATIVE REPORT   PREOPERATIVE DIAGNOSIS:  Stenosing tenosynovitis, flexor tendons, left middle finger.  POSTOPERATIVE DIAGNOSIS:  Stenosing tenosynovitis, flexor tendons, left middle finger.  OPERATION:  Tenosynovectomy of flexor tendons, left middle finger, palm and finger.  SURGEON:  Daryll Brod, M.D.  ANESTHESIA:  General with metacarpal block.  ANESTHESIOLOGIST:  Soledad Gerlach, MD.  PLACE OF SURGERY:  [facility].  HISTORY:  The patient is a 65 year old male with history of swelling of his left middle finger.  This has been present for at least a month.  It is not particularly painful, erythematous, hot.  He has no discrete history of arthritis.  This has not responded to conservative treatment. Injection did not resolve and actually made the swelling worst.  He was admitted for tenosynovectomy, possibility of an underlying arthritic problems and possibility of fungal infection, a typical mycobacterial infection or bacterial infection a very low-grade.  He was admitted for tenosynovectomy of left middle finger.  Pre, peri, and postoperative course have been discussed along with risks and complications.  He is aware that there was no guarantee with the surgery; possibility of infection; recurrence of injury to arteries, nerves, tendons; incomplete relief of symptoms; dystrophy; and possibility of stiffness.  In the preoperative area, the patient is seen, the extremity marked by both patient and surgeon.  DESCRIPTION OF PROCEDURE:  The patient was brought to the operating room where a general anesthetic was carried out without difficulty.  He was prepped using ChloraPrep, supine  position with the left arm free.  A 3- minute dry time was allowed, time-out taken, confirming the patient and procedure.  The limb was exsanguinated with an Esmarch bandage. Tourniquet placed high on the arm was inflated to 250 mmHg.  A volar Bruner incision was made, carried down through the subcutaneous tissue. Bleeders were electrocauterized with bipolar and significant edema was present about the entire finger.  The A1 pulley was released, a very significant synovitis was present, densely adherent to the flexor tendon especially superficialis.  This was followed distally.  The cruciate pulleys were then opened and a similar granulation-type tissue, brownish in color of tenosynovium was excised.      Cultures were taken aerobic, anaerobic, fungal and tuberculin, atypical mycobacterial infections.  The wound was copiously irrigated with saline.  The specimen was also sent to Pathology.  The entire flexor sheath was cleaned of any of the brownish tissue. Excision of the tenosynovial tissue was performed using, knife scissors and ronguer  The skin was then closed with interrupted 4-0 nylon sutures.  A metacarpal block was given with 0.25% bupivacaine without epinephrine.   A sterile compressive dressing, dorsal splint was applied. On deflation of the tourniquet, all fingers were immediately pinked.  He was taken to the recovery room for observation in satisfactory condition.  He will be discharged to home to return to the [facility] in 1 week, on [place].          ______________________________ Daryll Brod, M.D.     GK/MEDQ  D:  07/15/2015  T:  07/15/2015  Job:  [id number]

## 2015-07-15 NOTE — Anesthesia Procedure Notes (Signed)
Procedure Name: LMA Insertion Date/Time: 07/15/2015 12:49 PM Performed by: Lieutenant Diego Pre-anesthesia Checklist: Patient identified, Emergency Drugs available, Suction available and Patient being monitored Patient Re-evaluated:Patient Re-evaluated prior to inductionOxygen Delivery Method: Circle System Utilized Preoxygenation: Pre-oxygenation with 100% oxygen Intubation Type: IV induction Ventilation: Mask ventilation without difficulty LMA: LMA inserted LMA Size: 5.0 Number of attempts: 1 Airway Equipment and Method: Bite block Placement Confirmation: positive ETCO2 and breath sounds checked- equal and bilateral Tube secured with: Tape Dental Injury: Teeth and Oropharynx as per pre-operative assessment

## 2015-07-15 NOTE — Op Note (Signed)
Dictation Number [id number]

## 2015-07-15 NOTE — Brief Op Note (Signed)
07/15/2015  1:34 PM  PATIENT:  Albertha Ghee  65 y.o. male  PRE-OPERATIVE DIAGNOSIS:  TENOSYNOVITIS LEFT MIDDLE FINGER FLEXOR SHEATH  POST-OPERATIVE DIAGNOSIS:  TENOSYNOVITIS LEFT MIDDLE FINGER FLEXOR SHEATH  PROCEDURE:  Procedure(s): TENOSYNOVECTOMY LEFT FLEXORS LEFT MIDDLE FINGER (Left)  SURGEON:  Surgeon(s) and Role:    * Daryll Brod, MD - Primary  PHYSICIAN ASSISTANT:   ASSISTANTS: none   ANESTHESIA:   local and general  EBL:  Total I/O In: 500 [I.V.:500] Out: 3 [Blood:3]  BLOOD ADMINISTERED:none  DRAINS: none   LOCAL MEDICATIONS USED:  BUPIVICAINE   SPECIMEN:  Source of Specimen:  biopsy and cultures  DISPOSITION OF SPECIMEN:  PATHOLOGY  COUNTS:  YES  TOURNIQUET:   Total Tourniquet Time Documented: Upper Arm (Left) - 31 minutes Total: Upper Arm (Left) - 31 minutes   DICTATION: .Other Dictation: Dictation Number [id number]  PLAN OF CARE: Discharge to home after PACU  PATIENT DISPOSITION:  PACU - hemodynamically stable.

## 2015-07-15 NOTE — Anesthesia Preprocedure Evaluation (Addendum)
Anesthesia Evaluation  Patient identified by MRN, date of birth, ID band Patient awake    Reviewed: Allergy & Precautions, H&P , Patient's Chart, lab work & pertinent test results  Airway Mallampati: I  TM Distance: >3 FB Neck ROM: full    Dental no notable dental hx. (+) Teeth Intact   Pulmonary neg pulmonary ROS,    Pulmonary exam normal        Cardiovascular Normal cardiovascular exam     Neuro/Psych negative neurological ROS     GI/Hepatic Neg liver ROS,   Endo/Other  negative endocrine ROS  Renal/GU negative Renal ROS     Musculoskeletal   Abdominal Normal abdominal exam  (+)   Peds  Hematology negative hematology ROS (+)   Anesthesia Other Findings   Reproductive/Obstetrics negative OB ROS                             Anesthesia Physical Anesthesia Plan  ASA: II  Anesthesia Plan: General   Post-op Pain Management:    Induction: Intravenous  Airway Management Planned: LMA  Additional Equipment:   Intra-op Plan:   Post-operative Plan:   Informed Consent: I have reviewed the patients History and Physical, chart, labs and discussed the procedure including the risks, benefits and alternatives for the proposed anesthesia with the patient or authorized representative who has indicated his/her understanding and acceptance.     Plan Discussed with: CRNA  Anesthesia Plan Comments:         Anesthesia Quick Evaluation

## 2015-07-15 NOTE — Transfer of Care (Signed)
Immediate Anesthesia Transfer of Care Note  Patient: Juan Shepard  Procedure(s) Performed: Procedure(s): TENOSYNOVECTOMY LEFT FLEXORS LEFT MIDDLE FINGER (Left)  Patient Location: PACU  Anesthesia Type:General  Level of Consciousness: sedated  Airway & Oxygen Therapy: Patient Spontanous Breathing and Patient connected to face mask oxygen  Post-op Assessment: Report given to RN and Post -op Vital signs reviewed and stable  Post vital signs: Reviewed and stable  Last Vitals:  Filed Vitals:   07/15/15 1201 07/15/15 1341  BP: 132/74   Pulse: 65 62  Temp: 36.6 C   Resp: 16 8    Complications: No apparent anesthesia complications

## 2015-07-15 NOTE — H&P (Signed)
Juan Shepard is an 65 y.o. male.   Chief Complaint:swelling right hand HPI: Juan Shepard s 68, right-handed, he is referred by Dr. Harmon Pier with a mass on the dorsal aspect of his right hand. This has been present for the past several months. He recalls no history of injury. He was placed on a Medrol Dosepak, which he states has helped. I last saw him for swelling of the metacarpophalangeal joint left middle finger, which was injected, resolving symptoms. He is complaining of pain on his right side. The swelling is on the dorsal aspect with a VAS score of 1/10 without activity, 6/10 with activity. He is not complaining of loss of function per se. He was tested for rheumatoid arthritis, which was negative. He has had an MRI done. The report is reviewed, revealing swelling of the extensor tendons. The actual MRI is reviewed in total; this reveals swelling about the extensor tendons, fourth dorsal compartment, with significant fluid accumulation. He has been taking aspirin for atrial fibrillation. He has, again, no history of injury. He has developed stenosing tenosynovitis, left middle finger. An injection was given approximately one month ago. He had swelling of his finger at that time. This has gotten worse. He states that he is not having any pain with it. He has only discomfort if he fully flexes, the swelling is down the entire digit.    Past Medical History  Diagnosis Date  . Bipolar disorder ([place])   . Arthritis   . BPH (benign prostatic hyperplasia)   . Depression   . Anxiety   . GERD (gastroesophageal reflux disease)   . Tenosynovitis of fingers     LMF  . Dysrhythmia     PAF 01-2015, saw Dr Juan Shepard    Past Surgical History  Procedure Laterality Date  . Tonsillectomy      History reviewed. No pertinent family history. Social History:  reports that he has never smoked. He has never used smokeless tobacco. He reports that he does not drink alcohol or use illicit  drugs.  Allergies: No Known Allergies  Medications Prior to Admission  Medication Sig Dispense Refill  . Cholecalciferol (VITAMIN D PO) Take by mouth.    . Cyanocobalamin (VITAMIN B 12 PO) Take by mouth.    Marland Kitchen MAGNESIUM CHLORIDE ER PO Take by mouth.    . Multiple Vitamin (MULTIVITAMIN) tablet Take 1 tablet by mouth daily.    . Omega-3 Fatty Acids (FISH OIL PO) Take by mouth.    Marland Kitchen aspirin 325 MG tablet Take 325 mg by mouth daily.    . divalproex (DEPAKOTE ER) 500 MG 24 hr tablet Take 500 mg by mouth at bedtime.    Marland Kitchen FLUoxetine (PROZAC) 10 MG tablet Take 10 mg by mouth daily.    Marland Kitchen OLANZapine (ZYPREXA) 5 MG tablet Take 5 mg by mouth at bedtime.    Marland Kitchen omeprazole (PRILOSEC) 20 MG capsule Take 20 mg by mouth daily.    . tamsulosin (FLOMAX) 0.4 MG CAPS capsule Take 0.4 mg by mouth daily after supper.      No results found for this or any previous visit (from the past 48 hour(s)).  No results found.   Pertinent items are noted in HPI.  Height 5\' 11"  (1.803 m), weight 95.255 kg (210 lb).  General appearance: alert, cooperative and appears stated age Head: Normocephalic, without obvious abnormality Neck: no JVD Resp: clear to auscultation bilaterally Cardio: regular rate and rhythm, S1, S2 normal, no murmur, click, rub or gallop GI: soft,  non-tender; bowel sounds normal; no masses,  no organomegaly Extremities: swelling left middle finger Pulses: 2+ and symmetric Skin: Skin color, texture, turgor normal. No rashes or lesions Neurologic: Grossly normal Incision/Wound: na  Assessment/Plan PLAN/DIAGNOSIS:Flexor tenosynovitis left middle fingert. He is advised that there is a potential that this could be rheumatoid arthritis with seronegative component.We recommend tenosynovectomy at this point in time, of the entire flexor sheath.  He is scheduled for flexor tendon tenosynovectomy, left middle finger as an outpatient under regional anesthesia. He states it has been present since before his  injection.    Esdras Delair R 07/15/2015, 11:48 AM

## 2015-07-15 NOTE — Discharge Instructions (Addendum)

## 2015-07-16 ENCOUNTER — Encounter (HOSPITAL_BASED_OUTPATIENT_CLINIC_OR_DEPARTMENT_OTHER): Payer: Self-pay | Admitting: Orthopedic Surgery

## 2015-07-18 LAB — TISSUE CULTURE: GRAM STAIN: NONE SEEN

## 2015-07-20 LAB — ANAEROBIC CULTURE: Gram Stain: NONE SEEN

## 2015-07-30 DIAGNOSIS — M25642 Stiffness of left hand, not elsewhere classified: Secondary | ICD-10-CM | POA: Diagnosis not present

## 2015-07-30 DIAGNOSIS — M25442 Effusion, left hand: Secondary | ICD-10-CM | POA: Diagnosis not present

## 2015-07-30 DIAGNOSIS — M65042 Abscess of tendon sheath, left hand: Secondary | ICD-10-CM | POA: Diagnosis not present

## 2015-07-30 DIAGNOSIS — M79645 Pain in left finger(s): Secondary | ICD-10-CM | POA: Diagnosis not present

## 2015-08-06 DIAGNOSIS — M79645 Pain in left finger(s): Secondary | ICD-10-CM | POA: Diagnosis not present

## 2015-08-06 DIAGNOSIS — M25442 Effusion, left hand: Secondary | ICD-10-CM | POA: Diagnosis not present

## 2015-08-06 DIAGNOSIS — M65042 Abscess of tendon sheath, left hand: Secondary | ICD-10-CM | POA: Diagnosis not present

## 2015-08-06 DIAGNOSIS — M25642 Stiffness of left hand, not elsewhere classified: Secondary | ICD-10-CM | POA: Diagnosis not present

## 2015-08-13 DIAGNOSIS — M65042 Abscess of tendon sheath, left hand: Secondary | ICD-10-CM | POA: Diagnosis not present

## 2015-08-13 DIAGNOSIS — M79645 Pain in left finger(s): Secondary | ICD-10-CM | POA: Diagnosis not present

## 2015-08-13 DIAGNOSIS — M25442 Effusion, left hand: Secondary | ICD-10-CM | POA: Diagnosis not present

## 2015-08-13 DIAGNOSIS — M25642 Stiffness of left hand, not elsewhere classified: Secondary | ICD-10-CM | POA: Diagnosis not present

## 2015-08-17 LAB — FUNGUS CULTURE WITH STAIN

## 2015-08-17 LAB — FUNGAL ORGANISM REFLEX

## 2015-08-17 LAB — FUNGUS CULTURE RESULT

## 2015-08-20 DIAGNOSIS — M25642 Stiffness of left hand, not elsewhere classified: Secondary | ICD-10-CM | POA: Diagnosis not present

## 2015-08-20 DIAGNOSIS — M79645 Pain in left finger(s): Secondary | ICD-10-CM | POA: Diagnosis not present

## 2015-08-20 DIAGNOSIS — M65042 Abscess of tendon sheath, left hand: Secondary | ICD-10-CM | POA: Diagnosis not present

## 2015-08-27 DIAGNOSIS — M25442 Effusion, left hand: Secondary | ICD-10-CM | POA: Diagnosis not present

## 2015-08-27 DIAGNOSIS — M79645 Pain in left finger(s): Secondary | ICD-10-CM | POA: Diagnosis not present

## 2015-08-27 DIAGNOSIS — M25642 Stiffness of left hand, not elsewhere classified: Secondary | ICD-10-CM | POA: Diagnosis not present

## 2015-09-03 DIAGNOSIS — M25642 Stiffness of left hand, not elsewhere classified: Secondary | ICD-10-CM | POA: Diagnosis not present

## 2015-09-03 DIAGNOSIS — M79645 Pain in left finger(s): Secondary | ICD-10-CM | POA: Diagnosis not present

## 2015-09-09 DIAGNOSIS — M25442 Effusion, left hand: Secondary | ICD-10-CM | POA: Diagnosis not present

## 2015-09-09 DIAGNOSIS — M25642 Stiffness of left hand, not elsewhere classified: Secondary | ICD-10-CM | POA: Diagnosis not present

## 2015-09-09 DIAGNOSIS — M79645 Pain in left finger(s): Secondary | ICD-10-CM | POA: Diagnosis not present

## 2015-09-20 DIAGNOSIS — R531 Weakness: Secondary | ICD-10-CM | POA: Diagnosis not present

## 2015-09-20 DIAGNOSIS — M25649 Stiffness of unspecified hand, not elsewhere classified: Secondary | ICD-10-CM | POA: Diagnosis not present

## 2015-09-23 DIAGNOSIS — R69 Illness, unspecified: Secondary | ICD-10-CM | POA: Diagnosis not present

## 2015-09-23 DIAGNOSIS — Z79899 Other long term (current) drug therapy: Secondary | ICD-10-CM | POA: Diagnosis not present

## 2015-11-18 DIAGNOSIS — Z79899 Other long term (current) drug therapy: Secondary | ICD-10-CM | POA: Diagnosis not present

## 2015-11-21 DIAGNOSIS — D125 Benign neoplasm of sigmoid colon: Secondary | ICD-10-CM | POA: Diagnosis not present

## 2015-11-21 DIAGNOSIS — Z1211 Encounter for screening for malignant neoplasm of colon: Secondary | ICD-10-CM | POA: Diagnosis not present

## 2015-11-21 DIAGNOSIS — D120 Benign neoplasm of cecum: Secondary | ICD-10-CM | POA: Diagnosis not present

## 2015-11-21 DIAGNOSIS — D122 Benign neoplasm of ascending colon: Secondary | ICD-10-CM | POA: Diagnosis not present

## 2015-11-26 DIAGNOSIS — Z1211 Encounter for screening for malignant neoplasm of colon: Secondary | ICD-10-CM | POA: Diagnosis not present

## 2015-11-26 DIAGNOSIS — Z86010 Personal history of colonic polyps: Secondary | ICD-10-CM | POA: Diagnosis not present

## 2015-11-30 DIAGNOSIS — J0100 Acute maxillary sinusitis, unspecified: Secondary | ICD-10-CM | POA: Diagnosis not present

## 2015-12-22 DIAGNOSIS — K648 Other hemorrhoids: Secondary | ICD-10-CM | POA: Diagnosis not present

## 2015-12-22 DIAGNOSIS — Z86010 Personal history of colonic polyps: Secondary | ICD-10-CM | POA: Diagnosis not present

## 2015-12-22 DIAGNOSIS — R69 Illness, unspecified: Secondary | ICD-10-CM | POA: Diagnosis not present

## 2015-12-22 DIAGNOSIS — Z79899 Other long term (current) drug therapy: Secondary | ICD-10-CM | POA: Diagnosis not present

## 2015-12-22 DIAGNOSIS — D125 Benign neoplasm of sigmoid colon: Secondary | ICD-10-CM | POA: Diagnosis not present

## 2015-12-22 DIAGNOSIS — K5730 Diverticulosis of large intestine without perforation or abscess without bleeding: Secondary | ICD-10-CM | POA: Diagnosis not present

## 2015-12-22 DIAGNOSIS — Z1211 Encounter for screening for malignant neoplasm of colon: Secondary | ICD-10-CM | POA: Diagnosis not present

## 2015-12-22 DIAGNOSIS — I4891 Unspecified atrial fibrillation: Secondary | ICD-10-CM | POA: Diagnosis not present

## 2015-12-22 DIAGNOSIS — E7800 Pure hypercholesterolemia, unspecified: Secondary | ICD-10-CM | POA: Diagnosis not present

## 2016-02-09 DIAGNOSIS — R69 Illness, unspecified: Secondary | ICD-10-CM | POA: Diagnosis not present

## 2016-02-24 DIAGNOSIS — M25562 Pain in left knee: Secondary | ICD-10-CM | POA: Diagnosis not present

## 2016-02-24 DIAGNOSIS — M25462 Effusion, left knee: Secondary | ICD-10-CM | POA: Diagnosis not present

## 2016-03-01 DIAGNOSIS — M25562 Pain in left knee: Secondary | ICD-10-CM | POA: Diagnosis not present

## 2016-03-02 DIAGNOSIS — M25562 Pain in left knee: Secondary | ICD-10-CM | POA: Diagnosis not present

## 2016-03-02 DIAGNOSIS — M1712 Unilateral primary osteoarthritis, left knee: Secondary | ICD-10-CM | POA: Diagnosis not present

## 2016-03-02 DIAGNOSIS — M25462 Effusion, left knee: Secondary | ICD-10-CM | POA: Diagnosis not present

## 2016-03-29 DIAGNOSIS — R69 Illness, unspecified: Secondary | ICD-10-CM | POA: Diagnosis not present

## 2016-04-19 DIAGNOSIS — J209 Acute bronchitis, unspecified: Secondary | ICD-10-CM | POA: Diagnosis not present

## 2016-05-30 DIAGNOSIS — R69 Illness, unspecified: Secondary | ICD-10-CM | POA: Diagnosis not present

## 2016-06-07 DIAGNOSIS — E781 Pure hyperglyceridemia: Secondary | ICD-10-CM | POA: Diagnosis not present

## 2016-06-07 DIAGNOSIS — Z1159 Encounter for screening for other viral diseases: Secondary | ICD-10-CM | POA: Diagnosis not present

## 2016-06-07 DIAGNOSIS — Z131 Encounter for screening for diabetes mellitus: Secondary | ICD-10-CM | POA: Diagnosis not present

## 2016-06-07 DIAGNOSIS — Z0000 Encounter for general adult medical examination without abnormal findings: Secondary | ICD-10-CM | POA: Diagnosis not present

## 2016-06-07 DIAGNOSIS — Z79899 Other long term (current) drug therapy: Secondary | ICD-10-CM | POA: Diagnosis not present

## 2016-06-07 DIAGNOSIS — N401 Enlarged prostate with lower urinary tract symptoms: Secondary | ICD-10-CM | POA: Diagnosis not present

## 2016-06-07 DIAGNOSIS — Z125 Encounter for screening for malignant neoplasm of prostate: Secondary | ICD-10-CM | POA: Diagnosis not present

## 2016-06-07 DIAGNOSIS — M19042 Primary osteoarthritis, left hand: Secondary | ICD-10-CM | POA: Diagnosis not present

## 2016-06-07 DIAGNOSIS — I480 Paroxysmal atrial fibrillation: Secondary | ICD-10-CM | POA: Diagnosis not present

## 2016-06-07 DIAGNOSIS — M19041 Primary osteoarthritis, right hand: Secondary | ICD-10-CM | POA: Diagnosis not present

## 2016-07-18 DIAGNOSIS — H524 Presbyopia: Secondary | ICD-10-CM | POA: Diagnosis not present

## 2016-07-18 DIAGNOSIS — H2513 Age-related nuclear cataract, bilateral: Secondary | ICD-10-CM | POA: Diagnosis not present

## 2016-08-14 IMAGING — CT CT HEAD W/O CM
1 series · 16 of 30 positions shown, 20 images · non-contrast
Comparison: None.

CLINICAL DATA: Depression.

EXAM:
CT HEAD WITHOUT CONTRAST
TECHNIQUE: Contiguous axial images were obtained from the base of the skull
through the vertex without intravenous contrast.

[Series 2: head 5.0 h31s · axial · 0.46mm/px · z∈[+1672,+1822]mm · 16 of 34 slices shown, 20 images]
[im 2/34  brain]
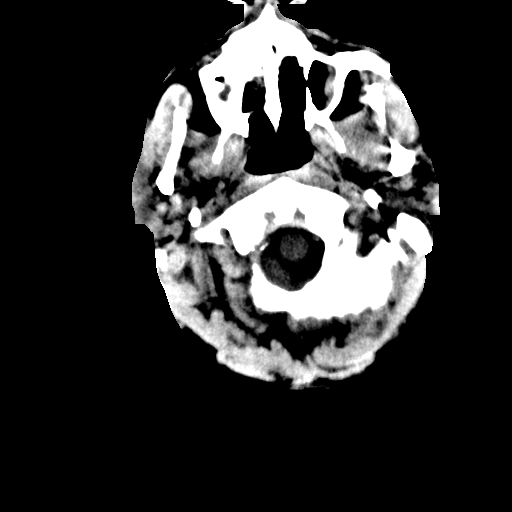
[im 2/34  bone]
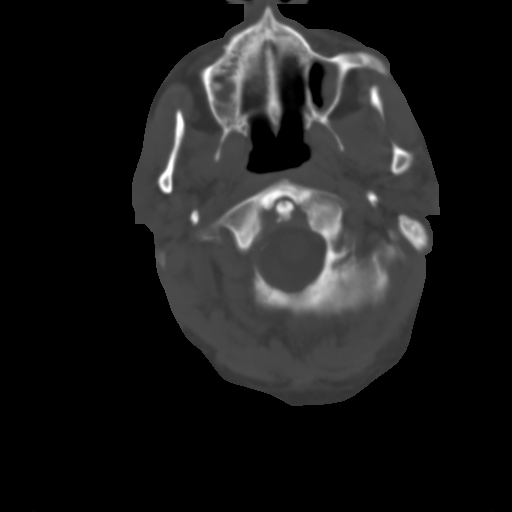
[im 4/34  brain]
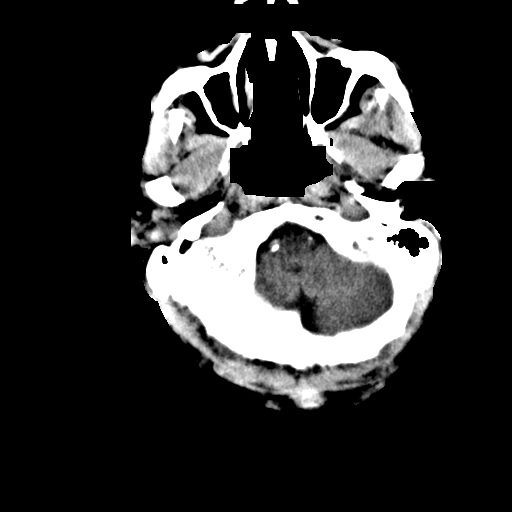
[im 6/34  brain]
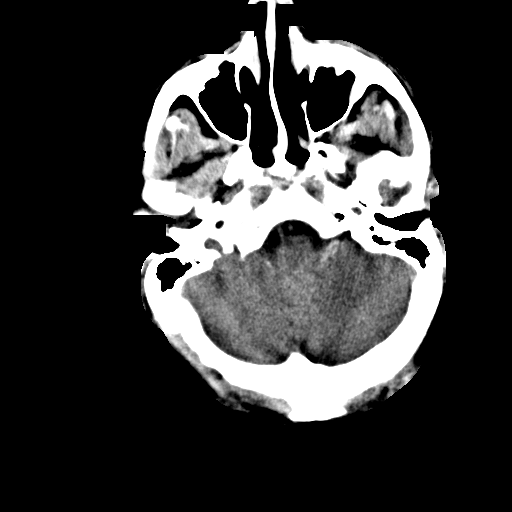
[im 8/34  brain]
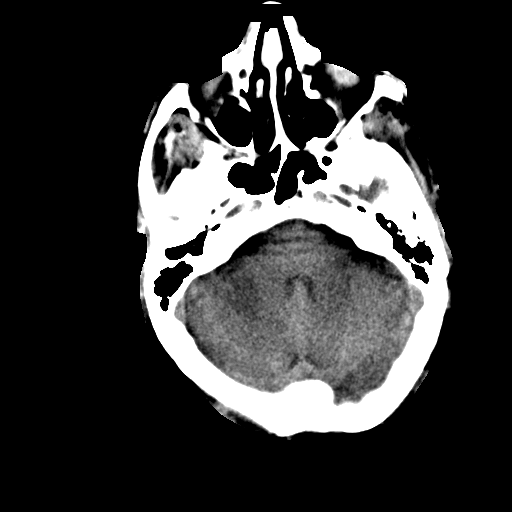
[im 10/34  brain]
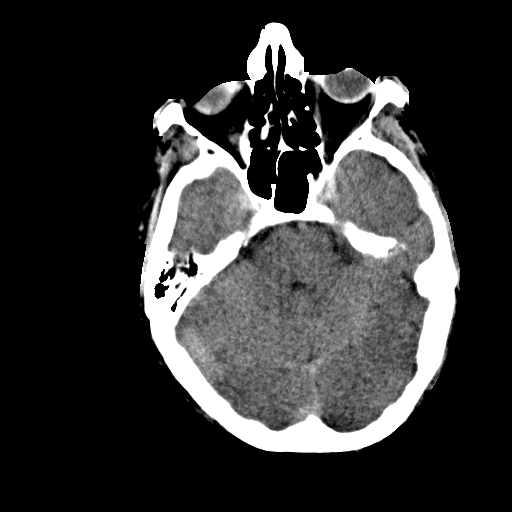
[im 10/34  bone]
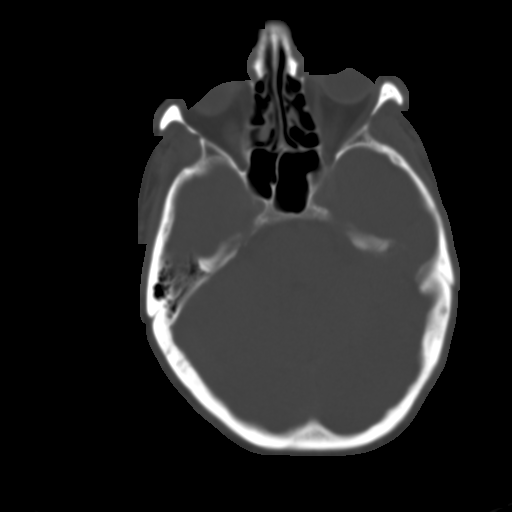
[im 12/34  brain]
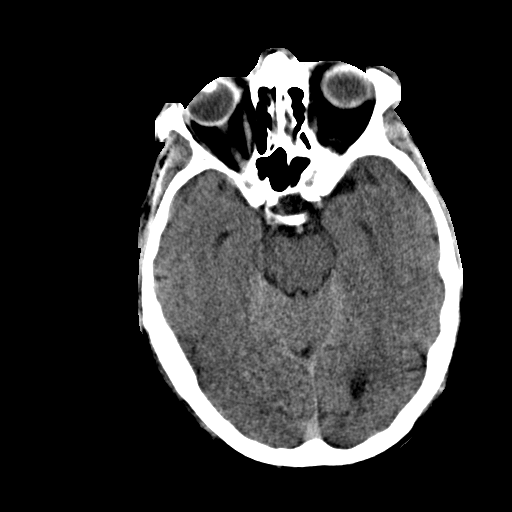
[im 14/34  brain]
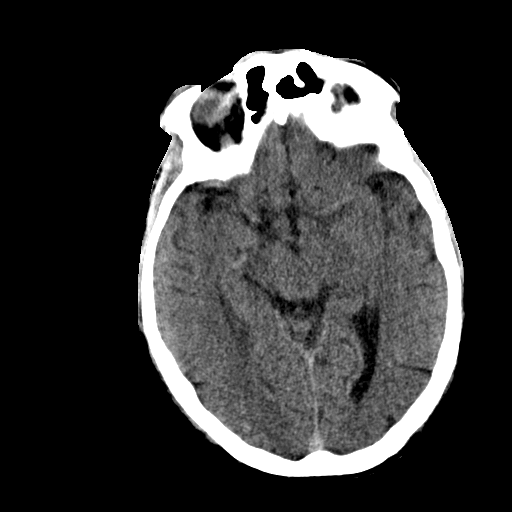
[im 16/34  brain]
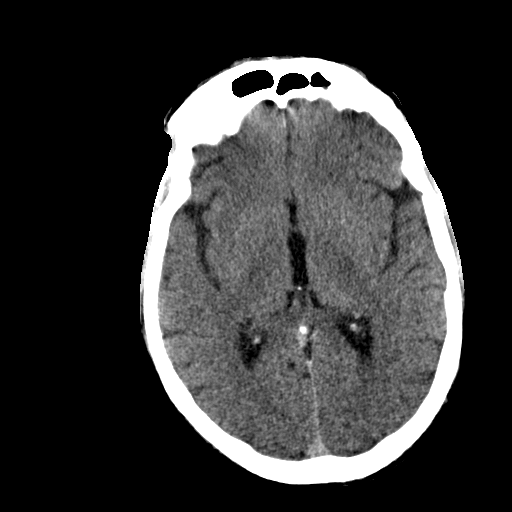
[im 18/34  brain]
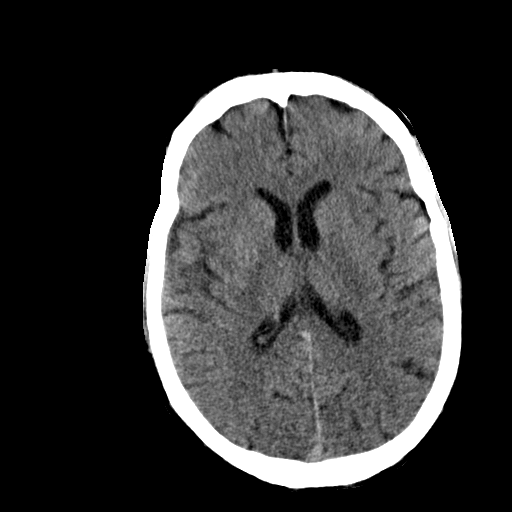
[im 18/34  bone]
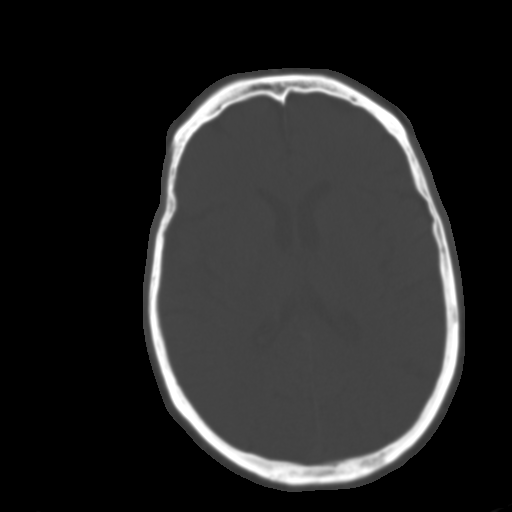
[im 20/34  brain]
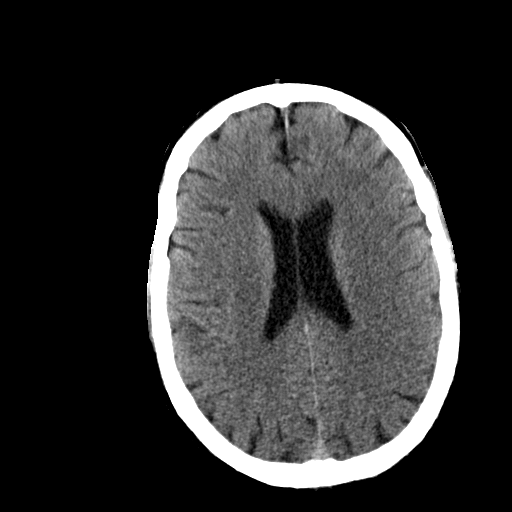
[im 22/34  brain]
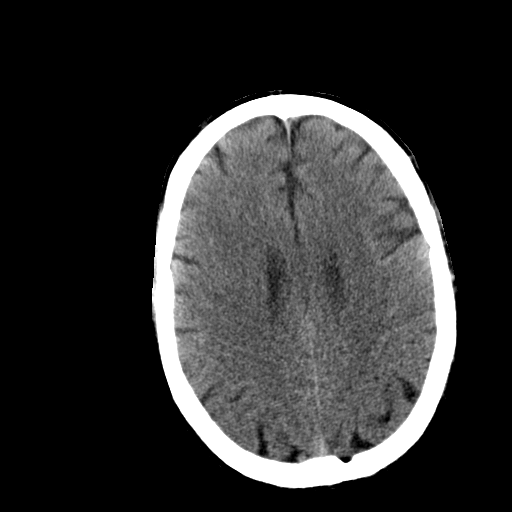
[im 24/34  brain]
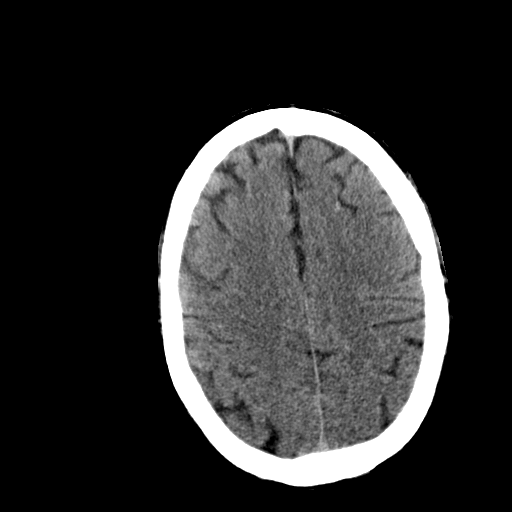
[im 26/34  brain]
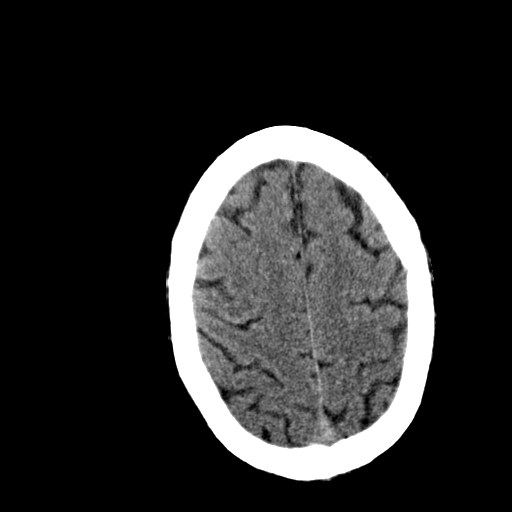
[im 26/34  bone]
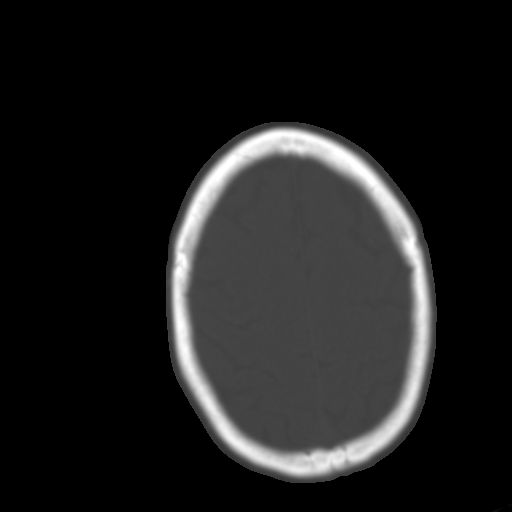
[im 28/34  brain]
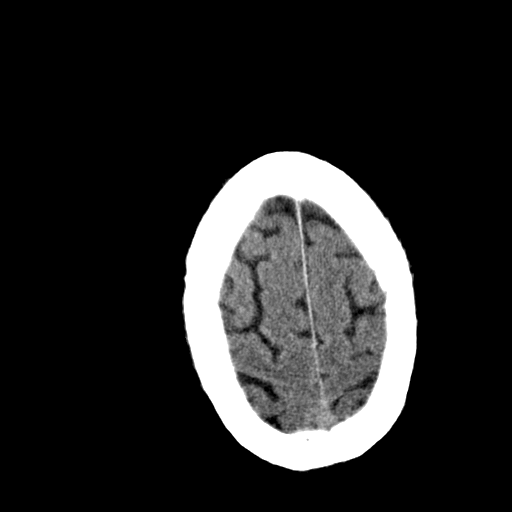
[im 30/34  brain]
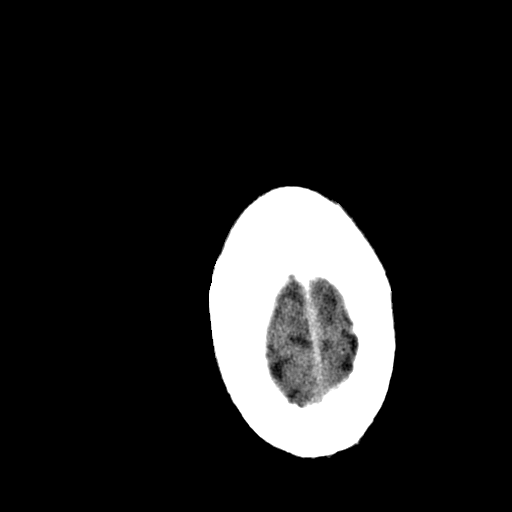
[im 32/34  brain]
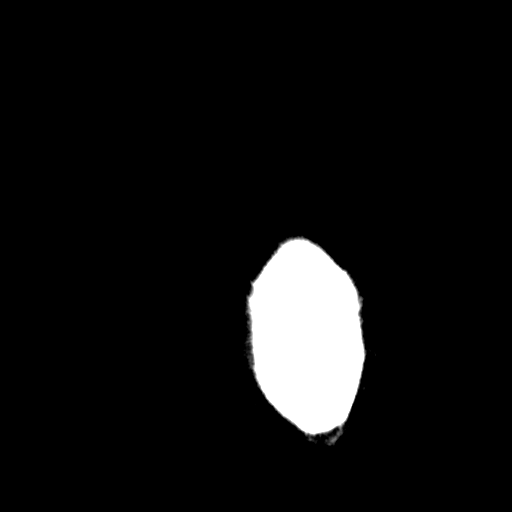

[16 of 30 positions shown; findings below may reference images not displayed]

FINDINGS: No intracranial hemorrhage, mass effect, or midline shift. No
hydrocephalus. The basilar cisterns are patent. No evidence of
territorial infarct. No intracranial fluid collection. Calvarium is
intact. There is mucosal thickening involving the maxillary sinuses,
to a lesser extent ethmoid air cells and frontal sinuses. No fluid
levels. The mastoid air cells are well aerated.
IMPRESSION: 1.  No acute intracranial abnormality.
2. Mild paranasal sinus disease.

## 2016-08-16 IMAGING — CR DG CHEST 2V
2 series · 2 of 2 positions shown · non-contrast
Comparison: None.

CLINICAL DATA: Depression.

EXAM:
CHEST  2 VIEW

[w chest pa]
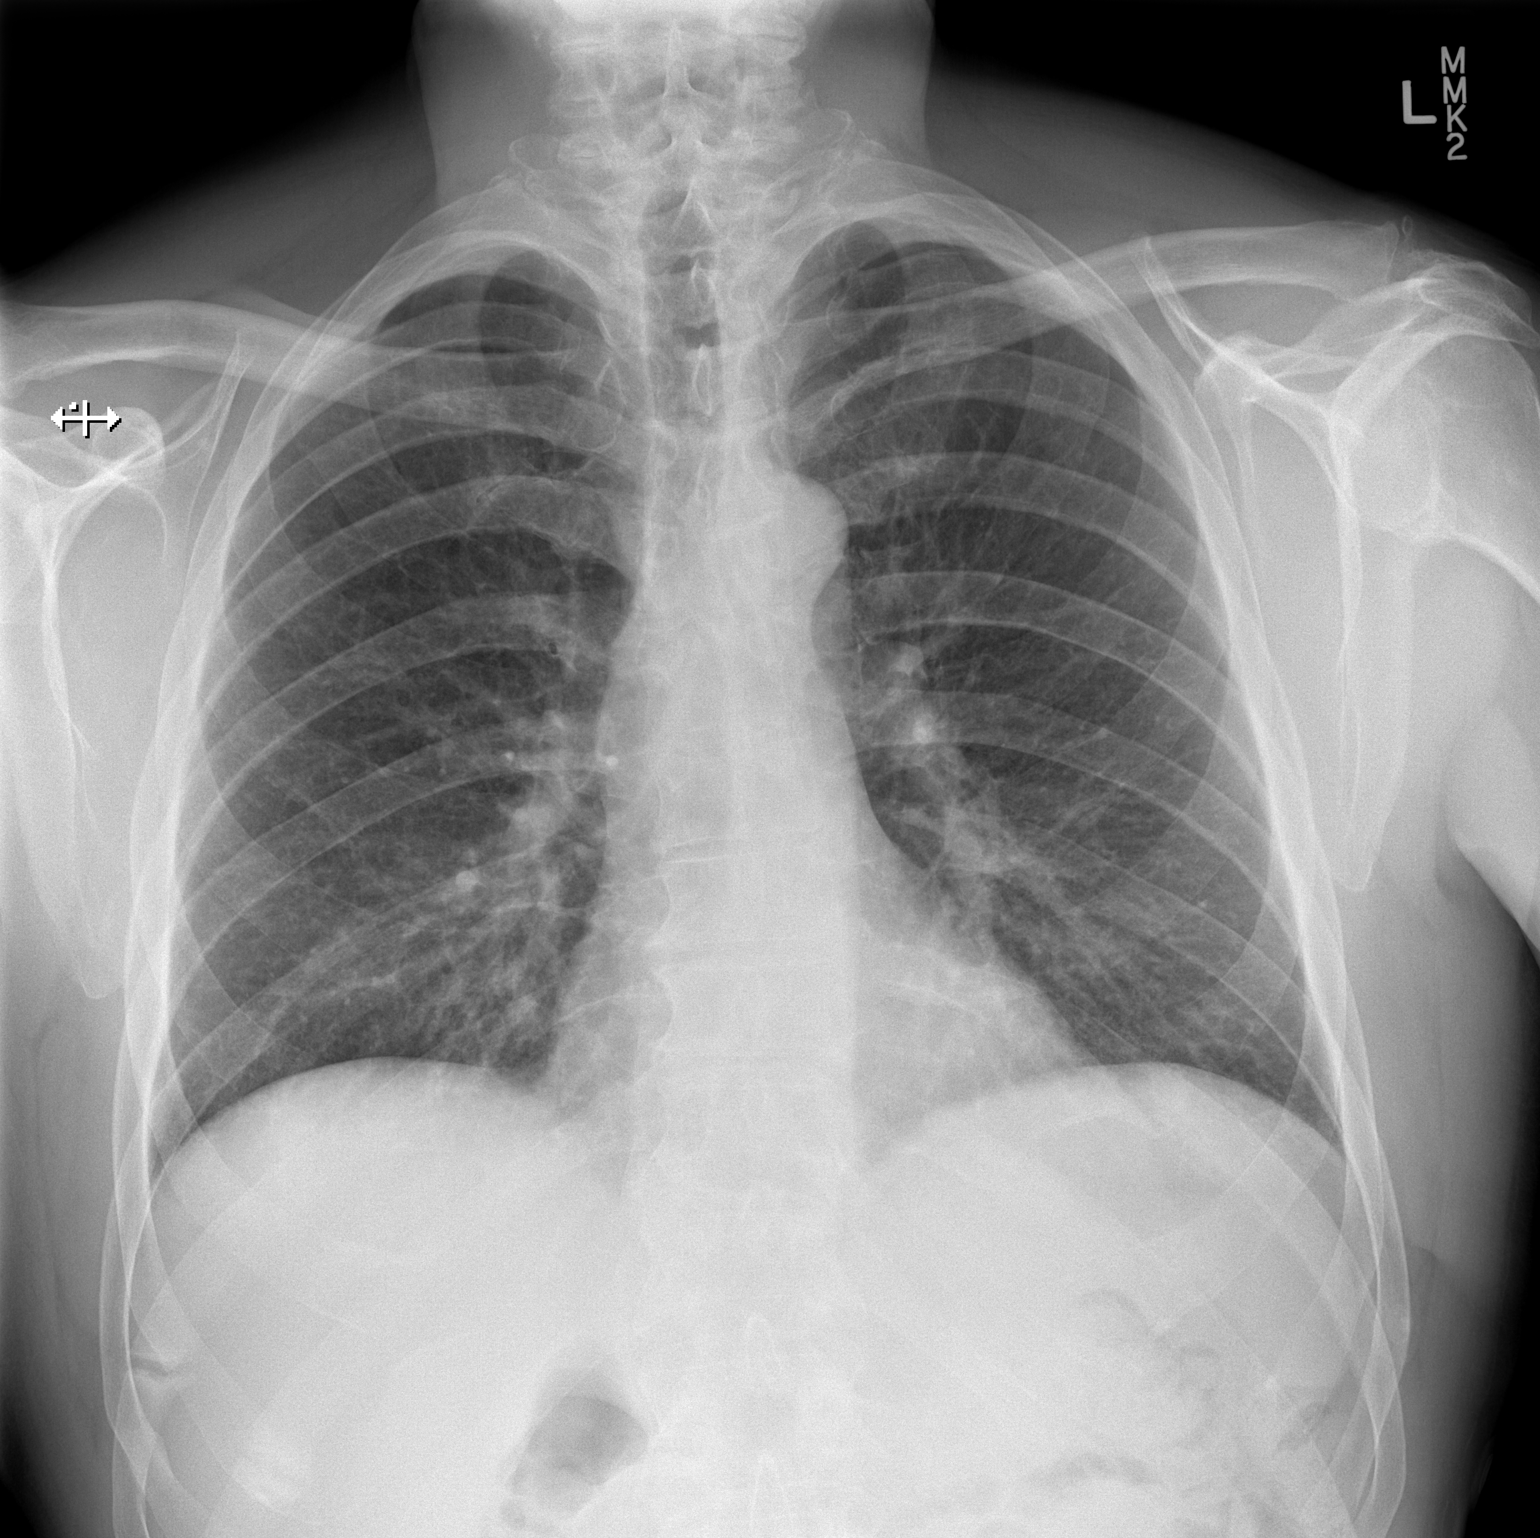

[w chest lat]
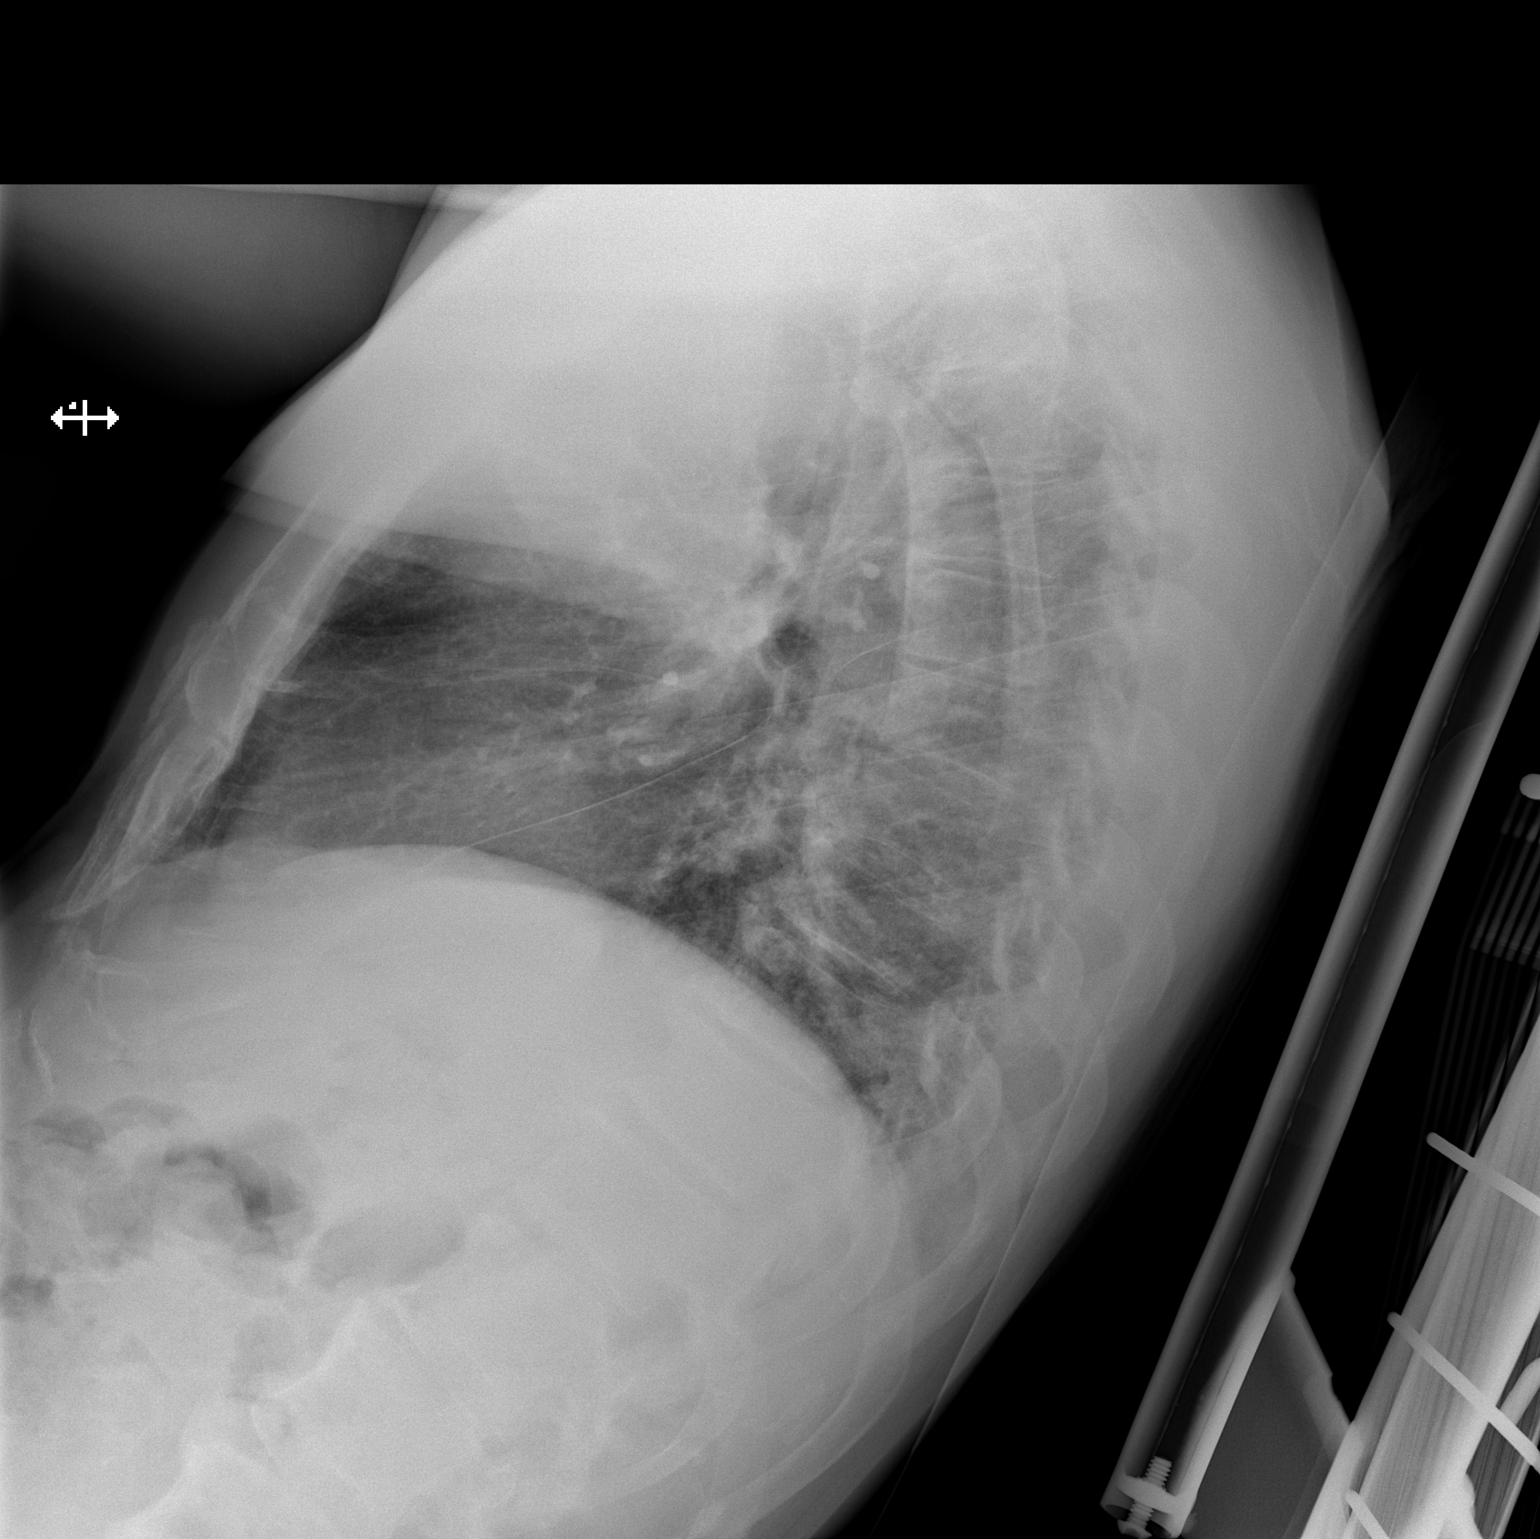

[2 of 2 positions shown; findings below may reference images not displayed]

FINDINGS: Mild peribronchial thickening. Heart and mediastinal contours are
within normal limits. No focal opacities or effusions. No acute bony
abnormality.
IMPRESSION: Mild bronchitic changes.

## 2016-08-23 DIAGNOSIS — R69 Illness, unspecified: Secondary | ICD-10-CM | POA: Diagnosis not present

## 2016-10-09 DIAGNOSIS — M9903 Segmental and somatic dysfunction of lumbar region: Secondary | ICD-10-CM | POA: Diagnosis not present

## 2016-10-09 DIAGNOSIS — S39012A Strain of muscle, fascia and tendon of lower back, initial encounter: Secondary | ICD-10-CM | POA: Diagnosis not present

## 2016-10-09 DIAGNOSIS — S336XXA Sprain of sacroiliac joint, initial encounter: Secondary | ICD-10-CM | POA: Diagnosis not present

## 2016-10-09 DIAGNOSIS — M5441 Lumbago with sciatica, right side: Secondary | ICD-10-CM | POA: Diagnosis not present

## 2016-10-09 DIAGNOSIS — M5136 Other intervertebral disc degeneration, lumbar region: Secondary | ICD-10-CM | POA: Diagnosis not present

## 2016-10-09 DIAGNOSIS — M9905 Segmental and somatic dysfunction of pelvic region: Secondary | ICD-10-CM | POA: Diagnosis not present

## 2016-10-09 DIAGNOSIS — M9902 Segmental and somatic dysfunction of thoracic region: Secondary | ICD-10-CM | POA: Diagnosis not present

## 2016-10-10 DIAGNOSIS — M9902 Segmental and somatic dysfunction of thoracic region: Secondary | ICD-10-CM | POA: Diagnosis not present

## 2016-10-10 DIAGNOSIS — M5441 Lumbago with sciatica, right side: Secondary | ICD-10-CM | POA: Diagnosis not present

## 2016-10-10 DIAGNOSIS — M9905 Segmental and somatic dysfunction of pelvic region: Secondary | ICD-10-CM | POA: Diagnosis not present

## 2016-10-10 DIAGNOSIS — S39012A Strain of muscle, fascia and tendon of lower back, initial encounter: Secondary | ICD-10-CM | POA: Diagnosis not present

## 2016-10-10 DIAGNOSIS — M5136 Other intervertebral disc degeneration, lumbar region: Secondary | ICD-10-CM | POA: Diagnosis not present

## 2016-10-10 DIAGNOSIS — M9903 Segmental and somatic dysfunction of lumbar region: Secondary | ICD-10-CM | POA: Diagnosis not present

## 2016-10-10 DIAGNOSIS — S336XXA Sprain of sacroiliac joint, initial encounter: Secondary | ICD-10-CM | POA: Diagnosis not present

## 2016-10-12 DIAGNOSIS — S336XXA Sprain of sacroiliac joint, initial encounter: Secondary | ICD-10-CM | POA: Diagnosis not present

## 2016-10-12 DIAGNOSIS — M9905 Segmental and somatic dysfunction of pelvic region: Secondary | ICD-10-CM | POA: Diagnosis not present

## 2016-10-12 DIAGNOSIS — M9903 Segmental and somatic dysfunction of lumbar region: Secondary | ICD-10-CM | POA: Diagnosis not present

## 2016-10-12 DIAGNOSIS — M5441 Lumbago with sciatica, right side: Secondary | ICD-10-CM | POA: Diagnosis not present

## 2016-10-12 DIAGNOSIS — M9902 Segmental and somatic dysfunction of thoracic region: Secondary | ICD-10-CM | POA: Diagnosis not present

## 2016-10-12 DIAGNOSIS — M5136 Other intervertebral disc degeneration, lumbar region: Secondary | ICD-10-CM | POA: Diagnosis not present

## 2016-10-12 DIAGNOSIS — S39012A Strain of muscle, fascia and tendon of lower back, initial encounter: Secondary | ICD-10-CM | POA: Diagnosis not present

## 2016-10-16 DIAGNOSIS — S39012A Strain of muscle, fascia and tendon of lower back, initial encounter: Secondary | ICD-10-CM | POA: Diagnosis not present

## 2016-10-16 DIAGNOSIS — M5441 Lumbago with sciatica, right side: Secondary | ICD-10-CM | POA: Diagnosis not present

## 2016-10-16 DIAGNOSIS — M9903 Segmental and somatic dysfunction of lumbar region: Secondary | ICD-10-CM | POA: Diagnosis not present

## 2016-10-16 DIAGNOSIS — M9902 Segmental and somatic dysfunction of thoracic region: Secondary | ICD-10-CM | POA: Diagnosis not present

## 2016-10-16 DIAGNOSIS — S336XXA Sprain of sacroiliac joint, initial encounter: Secondary | ICD-10-CM | POA: Diagnosis not present

## 2016-10-16 DIAGNOSIS — M5136 Other intervertebral disc degeneration, lumbar region: Secondary | ICD-10-CM | POA: Diagnosis not present

## 2016-10-16 DIAGNOSIS — M9905 Segmental and somatic dysfunction of pelvic region: Secondary | ICD-10-CM | POA: Diagnosis not present

## 2016-10-17 DIAGNOSIS — M9902 Segmental and somatic dysfunction of thoracic region: Secondary | ICD-10-CM | POA: Diagnosis not present

## 2016-10-17 DIAGNOSIS — M9903 Segmental and somatic dysfunction of lumbar region: Secondary | ICD-10-CM | POA: Diagnosis not present

## 2016-10-17 DIAGNOSIS — S39012A Strain of muscle, fascia and tendon of lower back, initial encounter: Secondary | ICD-10-CM | POA: Diagnosis not present

## 2016-10-17 DIAGNOSIS — M5136 Other intervertebral disc degeneration, lumbar region: Secondary | ICD-10-CM | POA: Diagnosis not present

## 2016-10-17 DIAGNOSIS — M9905 Segmental and somatic dysfunction of pelvic region: Secondary | ICD-10-CM | POA: Diagnosis not present

## 2016-10-17 DIAGNOSIS — S336XXA Sprain of sacroiliac joint, initial encounter: Secondary | ICD-10-CM | POA: Diagnosis not present

## 2016-10-17 DIAGNOSIS — M5441 Lumbago with sciatica, right side: Secondary | ICD-10-CM | POA: Diagnosis not present

## 2016-10-19 DIAGNOSIS — M5441 Lumbago with sciatica, right side: Secondary | ICD-10-CM | POA: Diagnosis not present

## 2016-10-19 DIAGNOSIS — M9902 Segmental and somatic dysfunction of thoracic region: Secondary | ICD-10-CM | POA: Diagnosis not present

## 2016-10-19 DIAGNOSIS — S39012A Strain of muscle, fascia and tendon of lower back, initial encounter: Secondary | ICD-10-CM | POA: Diagnosis not present

## 2016-10-19 DIAGNOSIS — M9905 Segmental and somatic dysfunction of pelvic region: Secondary | ICD-10-CM | POA: Diagnosis not present

## 2016-10-19 DIAGNOSIS — M9903 Segmental and somatic dysfunction of lumbar region: Secondary | ICD-10-CM | POA: Diagnosis not present

## 2016-10-19 DIAGNOSIS — M5136 Other intervertebral disc degeneration, lumbar region: Secondary | ICD-10-CM | POA: Diagnosis not present

## 2016-10-19 DIAGNOSIS — S336XXA Sprain of sacroiliac joint, initial encounter: Secondary | ICD-10-CM | POA: Diagnosis not present

## 2016-10-23 DIAGNOSIS — M5136 Other intervertebral disc degeneration, lumbar region: Secondary | ICD-10-CM | POA: Diagnosis not present

## 2016-10-23 DIAGNOSIS — M9905 Segmental and somatic dysfunction of pelvic region: Secondary | ICD-10-CM | POA: Diagnosis not present

## 2016-10-23 DIAGNOSIS — S336XXA Sprain of sacroiliac joint, initial encounter: Secondary | ICD-10-CM | POA: Diagnosis not present

## 2016-10-23 DIAGNOSIS — M5441 Lumbago with sciatica, right side: Secondary | ICD-10-CM | POA: Diagnosis not present

## 2016-10-23 DIAGNOSIS — M9902 Segmental and somatic dysfunction of thoracic region: Secondary | ICD-10-CM | POA: Diagnosis not present

## 2016-10-23 DIAGNOSIS — M9903 Segmental and somatic dysfunction of lumbar region: Secondary | ICD-10-CM | POA: Diagnosis not present

## 2016-10-23 DIAGNOSIS — S39012A Strain of muscle, fascia and tendon of lower back, initial encounter: Secondary | ICD-10-CM | POA: Diagnosis not present

## 2016-10-24 DIAGNOSIS — M5136 Other intervertebral disc degeneration, lumbar region: Secondary | ICD-10-CM | POA: Diagnosis not present

## 2016-10-24 DIAGNOSIS — S336XXA Sprain of sacroiliac joint, initial encounter: Secondary | ICD-10-CM | POA: Diagnosis not present

## 2016-10-24 DIAGNOSIS — M5441 Lumbago with sciatica, right side: Secondary | ICD-10-CM | POA: Diagnosis not present

## 2016-10-24 DIAGNOSIS — M9902 Segmental and somatic dysfunction of thoracic region: Secondary | ICD-10-CM | POA: Diagnosis not present

## 2016-10-24 DIAGNOSIS — S39012A Strain of muscle, fascia and tendon of lower back, initial encounter: Secondary | ICD-10-CM | POA: Diagnosis not present

## 2016-10-24 DIAGNOSIS — M9903 Segmental and somatic dysfunction of lumbar region: Secondary | ICD-10-CM | POA: Diagnosis not present

## 2016-10-24 DIAGNOSIS — M9905 Segmental and somatic dysfunction of pelvic region: Secondary | ICD-10-CM | POA: Diagnosis not present

## 2016-10-25 DIAGNOSIS — R69 Illness, unspecified: Secondary | ICD-10-CM | POA: Diagnosis not present

## 2016-10-30 DIAGNOSIS — S336XXA Sprain of sacroiliac joint, initial encounter: Secondary | ICD-10-CM | POA: Diagnosis not present

## 2016-10-30 DIAGNOSIS — M9905 Segmental and somatic dysfunction of pelvic region: Secondary | ICD-10-CM | POA: Diagnosis not present

## 2016-10-30 DIAGNOSIS — M9902 Segmental and somatic dysfunction of thoracic region: Secondary | ICD-10-CM | POA: Diagnosis not present

## 2016-10-30 DIAGNOSIS — M9903 Segmental and somatic dysfunction of lumbar region: Secondary | ICD-10-CM | POA: Diagnosis not present

## 2016-10-30 DIAGNOSIS — S39012A Strain of muscle, fascia and tendon of lower back, initial encounter: Secondary | ICD-10-CM | POA: Diagnosis not present

## 2016-10-30 DIAGNOSIS — M5136 Other intervertebral disc degeneration, lumbar region: Secondary | ICD-10-CM | POA: Diagnosis not present

## 2016-10-30 DIAGNOSIS — M5441 Lumbago with sciatica, right side: Secondary | ICD-10-CM | POA: Diagnosis not present

## 2016-11-02 DIAGNOSIS — G9009 Other idiopathic peripheral autonomic neuropathy: Secondary | ICD-10-CM | POA: Diagnosis not present

## 2016-11-02 DIAGNOSIS — R69 Illness, unspecified: Secondary | ICD-10-CM | POA: Diagnosis not present

## 2016-11-03 DIAGNOSIS — G9009 Other idiopathic peripheral autonomic neuropathy: Secondary | ICD-10-CM | POA: Diagnosis not present

## 2016-11-03 DIAGNOSIS — R69 Illness, unspecified: Secondary | ICD-10-CM | POA: Diagnosis not present

## 2016-11-06 DIAGNOSIS — M5441 Lumbago with sciatica, right side: Secondary | ICD-10-CM | POA: Diagnosis not present

## 2016-11-06 DIAGNOSIS — S39012A Strain of muscle, fascia and tendon of lower back, initial encounter: Secondary | ICD-10-CM | POA: Diagnosis not present

## 2016-11-06 DIAGNOSIS — M5136 Other intervertebral disc degeneration, lumbar region: Secondary | ICD-10-CM | POA: Diagnosis not present

## 2016-11-06 DIAGNOSIS — M9902 Segmental and somatic dysfunction of thoracic region: Secondary | ICD-10-CM | POA: Diagnosis not present

## 2016-11-06 DIAGNOSIS — S336XXA Sprain of sacroiliac joint, initial encounter: Secondary | ICD-10-CM | POA: Diagnosis not present

## 2016-11-06 DIAGNOSIS — M9903 Segmental and somatic dysfunction of lumbar region: Secondary | ICD-10-CM | POA: Diagnosis not present

## 2016-11-06 DIAGNOSIS — M9905 Segmental and somatic dysfunction of pelvic region: Secondary | ICD-10-CM | POA: Diagnosis not present

## 2016-11-13 DIAGNOSIS — R1084 Generalized abdominal pain: Secondary | ICD-10-CM | POA: Diagnosis not present

## 2016-11-13 DIAGNOSIS — R109 Unspecified abdominal pain: Secondary | ICD-10-CM | POA: Diagnosis not present

## 2016-11-13 DIAGNOSIS — R0602 Shortness of breath: Secondary | ICD-10-CM | POA: Diagnosis not present

## 2016-11-16 DIAGNOSIS — Z6828 Body mass index (BMI) 28.0-28.9, adult: Secondary | ICD-10-CM | POA: Diagnosis not present

## 2016-11-16 DIAGNOSIS — R1013 Epigastric pain: Secondary | ICD-10-CM | POA: Diagnosis not present

## 2016-12-26 DIAGNOSIS — R69 Illness, unspecified: Secondary | ICD-10-CM | POA: Diagnosis not present

## 2017-01-24 DIAGNOSIS — R69 Illness, unspecified: Secondary | ICD-10-CM | POA: Diagnosis not present

## 2017-02-20 DIAGNOSIS — R69 Illness, unspecified: Secondary | ICD-10-CM | POA: Diagnosis not present

## 2017-04-03 DIAGNOSIS — Z6824 Body mass index (BMI) 24.0-24.9, adult: Secondary | ICD-10-CM | POA: Diagnosis not present

## 2017-04-03 DIAGNOSIS — R251 Tremor, unspecified: Secondary | ICD-10-CM | POA: Diagnosis not present

## 2017-04-03 DIAGNOSIS — Z0000 Encounter for general adult medical examination without abnormal findings: Secondary | ICD-10-CM | POA: Diagnosis not present

## 2017-04-03 DIAGNOSIS — N400 Enlarged prostate without lower urinary tract symptoms: Secondary | ICD-10-CM | POA: Diagnosis not present

## 2017-04-03 DIAGNOSIS — Z79899 Other long term (current) drug therapy: Secondary | ICD-10-CM | POA: Diagnosis not present

## 2017-04-03 DIAGNOSIS — Z791 Long term (current) use of non-steroidal anti-inflammatories (NSAID): Secondary | ICD-10-CM | POA: Diagnosis not present

## 2017-04-03 DIAGNOSIS — M19042 Primary osteoarthritis, left hand: Secondary | ICD-10-CM | POA: Diagnosis not present

## 2017-04-03 DIAGNOSIS — R69 Illness, unspecified: Secondary | ICD-10-CM | POA: Diagnosis not present

## 2017-04-03 DIAGNOSIS — M19041 Primary osteoarthritis, right hand: Secondary | ICD-10-CM | POA: Diagnosis not present

## 2017-04-19 DIAGNOSIS — Z23 Encounter for immunization: Secondary | ICD-10-CM | POA: Diagnosis not present

## 2017-04-19 DIAGNOSIS — R631 Polydipsia: Secondary | ICD-10-CM | POA: Diagnosis not present

## 2017-04-19 DIAGNOSIS — Z1331 Encounter for screening for depression: Secondary | ICD-10-CM | POA: Diagnosis not present

## 2017-04-19 DIAGNOSIS — Z1339 Encounter for screening examination for other mental health and behavioral disorders: Secondary | ICD-10-CM | POA: Diagnosis not present

## 2017-04-19 DIAGNOSIS — Z13228 Encounter for screening for other metabolic disorders: Secondary | ICD-10-CM | POA: Diagnosis not present

## 2017-04-19 DIAGNOSIS — R69 Illness, unspecified: Secondary | ICD-10-CM | POA: Diagnosis not present

## 2017-04-19 DIAGNOSIS — Z130 Encounter for screening for diseases of the blood and blood-forming organs and certain disorders involving the immune mechanism: Secondary | ICD-10-CM | POA: Diagnosis not present

## 2017-04-19 DIAGNOSIS — D519 Vitamin B12 deficiency anemia, unspecified: Secondary | ICD-10-CM | POA: Diagnosis not present

## 2017-04-19 DIAGNOSIS — Z1329 Encounter for screening for other suspected endocrine disorder: Secondary | ICD-10-CM | POA: Diagnosis not present

## 2017-04-19 DIAGNOSIS — Z79899 Other long term (current) drug therapy: Secondary | ICD-10-CM | POA: Diagnosis not present

## 2017-04-19 DIAGNOSIS — Z9181 History of falling: Secondary | ICD-10-CM | POA: Diagnosis not present

## 2017-05-02 DIAGNOSIS — R69 Illness, unspecified: Secondary | ICD-10-CM | POA: Diagnosis not present

## 2017-05-04 ENCOUNTER — Ambulatory Visit: Payer: Medicare HMO | Admitting: Sports Medicine

## 2017-05-04 ENCOUNTER — Encounter: Payer: Self-pay | Admitting: Sports Medicine

## 2017-05-04 ENCOUNTER — Ambulatory Visit (INDEPENDENT_AMBULATORY_CARE_PROVIDER_SITE_OTHER): Payer: Medicare HMO

## 2017-05-04 VITALS — BP 121/76 | HR 73 | Ht 71.0 in | Wt 214.0 lb

## 2017-05-04 DIAGNOSIS — M21621 Bunionette of right foot: Secondary | ICD-10-CM

## 2017-05-04 DIAGNOSIS — M79671 Pain in right foot: Secondary | ICD-10-CM | POA: Diagnosis not present

## 2017-05-04 DIAGNOSIS — M79672 Pain in left foot: Secondary | ICD-10-CM | POA: Diagnosis not present

## 2017-05-04 DIAGNOSIS — M779 Enthesopathy, unspecified: Secondary | ICD-10-CM | POA: Diagnosis not present

## 2017-05-04 DIAGNOSIS — M21622 Bunionette of left foot: Secondary | ICD-10-CM

## 2017-05-04 MED ORDER — TRIAMCINOLONE ACETONIDE 10 MG/ML IJ SUSP: 10.0000 mg | Freq: Once | INTRAMUSCULAR | Status: DC

## 2017-05-04 NOTE — Progress Notes (Signed)
Subjective: Juan Shepard is a 67 y.o. male patient who presents to office for evaluation of bilateral foot pain. Patient complains of progressive pain especially over the last month in the right and left foot at the along the sides.  Reports pain is worse with shoes that rub it irritates the sides of both feet or with certain angles that he turns his feet in.  Patient has tried wider shoes with no relief in symptoms. Patient denies any other pedal complaints. Denies injury/trip/fall/sprain/any causative factors.   Review of Systems  Musculoskeletal: Positive for joint pain.  All other systems reviewed and are negative.   Patient Active Problem List   Diagnosis Date Noted  . Depression     Current Outpatient Medications on File Prior to Visit  Medication Sig Dispense Refill  . aspirin 325 MG tablet Take 325 mg by mouth daily.    . celecoxib (CELEBREX) 200 MG capsule TAKE 1 CAPSULE BY MOUTH EVERY DAY FOR ARTHRITIS  3  . Cholecalciferol (VITAMIN D PO) Take by mouth.    . Cyanocobalamin (VITAMIN B 12 PO) Take by mouth.    . divalproex (DEPAKOTE ER) 500 MG 24 hr tablet Take 500 mg by mouth at bedtime.    Marland Kitchen FLUoxetine (PROZAC) 10 MG tablet Take 10 mg by mouth daily.    Marland Kitchen FLUoxetine (PROZAC) 20 MG capsule Take by mouth daily.  2  . LORazepam (ATIVAN) 0.5 MG tablet     . MAGNESIUM CHLORIDE ER PO Take by mouth.    . Multiple Vitamin (MULTIVITAMIN) tablet Take 1 tablet by mouth daily.    . Omega-3 Fatty Acids (FISH OIL PO) Take by mouth.    Marland Kitchen omeprazole (PRILOSEC) 20 MG capsule Take 20 mg by mouth daily.    . tamsulosin (FLOMAX) 0.4 MG CAPS capsule Take 0.4 mg by mouth daily after supper.    Marland Kitchen HYDROcodone-acetaminophen (NORCO) 10-325 MG tablet Take 1 tablet by mouth every 6 (six) hours as needed. (Patient not taking: Reported on 05/04/2017) 30 tablet 0  . OLANZapine (ZYPREXA) 5 MG tablet Take 5 mg by mouth at bedtime.     No current facility-administered medications on file prior to visit.      No Known Allergies  Objective:  General: Alert and oriented x3 in no acute distress  Dermatology: No open lesions bilateral lower extremities, no webspace macerations, no ecchymosis bilateral, all nails x 10 are well manicured.  Vascular: Dorsalis Pedis and Posterior Tibial pedal pulses palpable, Capillary Fill Time 3 seconds,(+) pedal hair growth bilateral, focal edema bilateral fifth metatarsophalangeal joints, Temperature gradient within normal limits.  Neurology: Johney Maine sensation intact via light touch bilateral.  Musculoskeletal: Moderate tenderness with palpation at fifth metatarsal phalangeal joints and tailor's bunion bilateral,No pain with calf compression bilateral.  There is pain with inversion of the forefoot and direct pressure along the fifth metatarsal phalangeal joints bilateral, strength within normal limits in all groups bilateral.   Gait: Antalgic gait  Xrays  Right and left foot   Impression: Severe diffuse arthritis in multiple joints, there is mild enlargement of the fifth metatarsal head supportive of the tailbone deformity with mild surrounding soft tissue swelling, there is calcaneal spurs present no other acute findings.  Assessment and Plan: Problem List Items Addressed This Visit    None    Visit Diagnoses    Capsulitis    -  Primary   Relevant Medications   triamcinolone acetonide (KENALOG) 10 MG/ML injection 10 mg   Tailor's bunionette, bilateral  Relevant Medications   triamcinolone acetonide (KENALOG) 10 MG/ML injection 10 mg   Other Relevant Orders   DG Foot Complete Right   DG Foot Complete Left   Bilateral foot pain           -Complete examination performed -Xrays reviewed -Discussed treatement options for capsulitis secondary to tailor bunion -After oral consent and aseptic prep, injected a mixture containing 1 ml of 2%  plain lidocaine, 1 ml 0.5% plain marcaine, 0.5 ml of kenalog 10 and 0.5 ml of dexamethasone phosphate into  fifth metatarsophalangeal joints bilateral without complication. Post-injection care discussed with patient.  -Recommend rest ice elevation and continue with bladder shoes -Dispensed silicone bunion shields -Patient to return to office in 5 weeks or as needed or sooner if condition worsens.  Landis Martins, DPM

## 2017-05-11 ENCOUNTER — Other Ambulatory Visit: Payer: Self-pay | Admitting: Sports Medicine

## 2017-05-11 DIAGNOSIS — M21622 Bunionette of left foot: Principal | ICD-10-CM

## 2017-05-11 DIAGNOSIS — M79672 Pain in left foot: Secondary | ICD-10-CM

## 2017-05-11 DIAGNOSIS — M79671 Pain in right foot: Secondary | ICD-10-CM

## 2017-05-11 DIAGNOSIS — M21621 Bunionette of right foot: Secondary | ICD-10-CM

## 2017-05-22 ENCOUNTER — Ambulatory Visit: Payer: Medicare HMO | Admitting: Podiatry

## 2017-05-22 DIAGNOSIS — M21621 Bunionette of right foot: Secondary | ICD-10-CM | POA: Diagnosis not present

## 2017-05-22 DIAGNOSIS — M21622 Bunionette of left foot: Secondary | ICD-10-CM

## 2017-05-22 DIAGNOSIS — L600 Ingrowing nail: Secondary | ICD-10-CM

## 2017-05-22 NOTE — Patient Instructions (Signed)

## 2017-05-22 NOTE — Progress Notes (Signed)
Subjective:  Patient ID: Juan Shepard, male    DOB: Jul 16, 1950,  MRN: [id number]  Chief Complaint  Patient presents with  . Toe Pain     left great toe is painful and turning colors  toe looks bruised and red no injury recalled  possible ingrown toe nail     67 y.o. male presents with and ingrown toenail to the left great toe.  This patient of Dr. Leeanne Rio who he is seeing for his tailor's bunion states that he is still having pain with those and has been using silicone padding but it is not helping.  Concerned he has "gangrene".  Denies diabetes.  States that the left great toe started to turn color and was causing him pain. Past Medical History:  Diagnosis Date  . Anxiety   . Arthritis   . Bipolar disorder ([place])   . BPH (benign prostatic hyperplasia)   . Depression   . Dysrhythmia    PAF 01-2015, saw Dr Jimmie Molly  . GERD (gastroesophageal reflux disease)   . Tenosynovitis of fingers    LMF   Past Surgical History:  Procedure Laterality Date  . TENOSYNOVECTOMY Left 07/15/2015   Procedure: TENOSYNOVECTOMY LEFT FLEXORS LEFT MIDDLE FINGER;  Surgeon: Daryll Brod, MD;  Location: [facility];  Service: Orthopedics;  Laterality: Left;  . TONSILLECTOMY      Current Outpatient Medications:  .  aspirin 325 MG tablet, Take 325 mg by mouth daily., Disp: , Rfl:  .  celecoxib (CELEBREX) 200 MG capsule, TAKE 1 CAPSULE BY MOUTH EVERY DAY FOR ARTHRITIS, Disp: , Rfl: 3 .  Cholecalciferol (VITAMIN D PO), Take by mouth., Disp: , Rfl:  .  Cyanocobalamin (VITAMIN B 12 PO), Take by mouth., Disp: , Rfl:  .  divalproex (DEPAKOTE ER) 500 MG 24 hr tablet, Take 500 mg by mouth at bedtime., Disp: , Rfl:  .  FLUoxetine (PROZAC) 10 MG tablet, Take 10 mg by mouth daily., Disp: , Rfl:  .  FLUoxetine (PROZAC) 20 MG capsule, Take by mouth daily., Disp: , Rfl: 2 .  HYDROcodone-acetaminophen (NORCO) 10-325 MG tablet, Take 1 tablet by mouth every 6 (six) hours as needed. (Patient not taking:  Reported on 05/04/2017), Disp: 30 tablet, Rfl: 0 .  LORazepam (ATIVAN) 0.5 MG tablet, , Disp: , Rfl:  .  MAGNESIUM CHLORIDE ER PO, Take by mouth., Disp: , Rfl:  .  Multiple Vitamin (MULTIVITAMIN) tablet, Take 1 tablet by mouth daily., Disp: , Rfl:  .  OLANZapine (ZYPREXA) 5 MG tablet, Take 5 mg by mouth at bedtime., Disp: , Rfl:  .  Omega-3 Fatty Acids (FISH OIL PO), Take by mouth., Disp: , Rfl:  .  omeprazole (PRILOSEC) 20 MG capsule, Take 20 mg by mouth daily., Disp: , Rfl:  .  tamsulosin (FLOMAX) 0.4 MG CAPS capsule, Take 0.4 mg by mouth daily after supper., Disp: , Rfl:   Current Facility-Administered Medications:  .  triamcinolone acetonide (KENALOG) 10 MG/ML injection 10 mg, 10 mg, Other, Once, Stover, Titorya, DPM  No Known Allergies Review of Systems Objective:  There were no vitals filed for this visit. General AA&O x3. Normal mood and affect.  Vascular Dorsalis pedis and posterior tibial pulses  present 2+ bilaterally  Capillary refill normal to all digits. Pedal hair growth normal.  Neurologic Epicritic sensation grossly present.  Dermatologic No open lesions. Interspaces clear of maceration. Nails well groomed and normal in appearance. Painful ingrowing nail at lateral nail borders of the hallux nail left.  Orthopedic: MMT  5/5 in dorsiflexion, plantarflexion, inversion, and eversion. Normal joint ROM without pain or crepitus. Pain to palpation about the ingrown nail.   Assessment & Plan:  Patient was evaluated and treated and all questions answered.  Tailor's bunion -Offloading tube foam dispensed  Ingrown Nail, left -Patient elects to proceed with ingrown toenail removal today -Ingrown nail excised. See procedure note. -Educated on post-procedure care including soaking. Written instructions provided. -Patient to follow up in 2 weeks for nail check.  Procedure: Excision of Ingrown Toenail Location: Left 1st toe medial nail borders. Anesthesia: Lidocaine 1% plain;  1.5 mL and Marcaine 0.5% plain; 1.5 mL, digital block. Skin Prep: Betadine. Dressing: Silvadene; telfa; dry, sterile, compression dressing. Technique: Following skin prep, the toe was exsanguinated and a tourniquet was secured at the base of the toe. The affected nail border was freed, split with a nail splitter, and excised.  The area was cleansed, the tourniquet was then removed and sterile dressing applied. Disposition: Patient tolerated procedure well. Patient to return in 2 weeks for follow-up.   Return in about 2 weeks (around 06/05/2017) for 2 wk nail check left 1st .

## 2017-06-08 ENCOUNTER — Encounter: Payer: Self-pay | Admitting: Sports Medicine

## 2017-06-08 ENCOUNTER — Ambulatory Visit: Payer: Medicare HMO | Admitting: Sports Medicine

## 2017-06-08 DIAGNOSIS — M21622 Bunionette of left foot: Secondary | ICD-10-CM

## 2017-06-08 DIAGNOSIS — M79672 Pain in left foot: Secondary | ICD-10-CM

## 2017-06-08 DIAGNOSIS — M21621 Bunionette of right foot: Secondary | ICD-10-CM

## 2017-06-08 DIAGNOSIS — M779 Enthesopathy, unspecified: Secondary | ICD-10-CM

## 2017-06-08 DIAGNOSIS — Z9889 Other specified postprocedural states: Secondary | ICD-10-CM

## 2017-06-08 DIAGNOSIS — L600 Ingrowing nail: Secondary | ICD-10-CM

## 2017-06-08 DIAGNOSIS — M79671 Pain in right foot: Secondary | ICD-10-CM

## 2017-06-08 NOTE — Progress Notes (Signed)
Subjective: Juan Shepard is a 67 y.o. male patient returns to office today for follow up evaluation after having Left Hallux medial permanent nail avulsion performed by Dr. March Rummage on 05-22-17. Patient has been soaking using epsom salt and applying topical antibiotic covered with bandaid daily. Patient admits to some soreness, deniesfever/chills/nausea/vomitting/any other related constitutional symptoms at this time.  Patient also states that his bunions hurt and there is still some pain on the right bunion deformity states that the previous padding did not stay in place and that he wants to discuss further options on what can be done for these bunions.  Patient Active Problem List   Diagnosis Date Noted  . Depression     Current Outpatient Medications on File Prior to Visit  Medication Sig Dispense Refill  . aspirin 325 MG tablet Take 325 mg by mouth daily.    . celecoxib (CELEBREX) 200 MG capsule TAKE 1 CAPSULE BY MOUTH EVERY DAY FOR ARTHRITIS  3  . Cholecalciferol (VITAMIN D PO) Take by mouth.    . Cyanocobalamin (VITAMIN B 12 PO) Take by mouth.    . divalproex (DEPAKOTE ER) 500 MG 24 hr tablet Take 500 mg by mouth at bedtime.    Marland Kitchen FLUoxetine (PROZAC) 10 MG tablet Take 10 mg by mouth daily.    Marland Kitchen FLUoxetine (PROZAC) 20 MG capsule Take by mouth daily.  2  . HYDROcodone-acetaminophen (NORCO) 10-325 MG tablet Take 1 tablet by mouth every 6 (six) hours as needed. (Patient not taking: Reported on 05/04/2017) 30 tablet 0  . LORazepam (ATIVAN) 0.5 MG tablet     . MAGNESIUM CHLORIDE ER PO Take by mouth.    . Multiple Vitamin (MULTIVITAMIN) tablet Take 1 tablet by mouth daily.    Marland Kitchen OLANZapine (ZYPREXA) 5 MG tablet Take 5 mg by mouth at bedtime.    . Omega-3 Fatty Acids (FISH OIL PO) Take by mouth.    Marland Kitchen omeprazole (PRILOSEC) 20 MG capsule Take 20 mg by mouth daily.    . tamsulosin (FLOMAX) 0.4 MG CAPS capsule Take 0.4 mg by mouth daily after supper.     Current Facility-Administered Medications on  File Prior to Visit  Medication Dose Route Frequency Provider Last Rate Last Dose  . triamcinolone acetonide (KENALOG) 10 MG/ML injection 10 mg  10 mg Other Once Landis Martins, DPM        No Known Allergies  Objective:  General: Well developed, nourished, in no acute distress, alert and oriented x3   Dermatology: Skin is warm, dry and supple bilateral.  Left hallux medial nail bed appears to be clean, dry, with mild granular tissue and surrounding eschar/scab. (-) Erythema. (-) Edema. (-) serosanguous drainage present. The remaining nails appear unremarkable at this time. There are no other lesions or other signs of infection  present.  Vascular: Dorsalis Pedis and Posterior Tibial pedal pulses palpable, Capillary Fill Time 3 seconds,(+) pedal hair growth bilateral, focal edema bilateral fifth metatarsophalangeal joints, Temperature gradient mildly decreased.  Neurology: Johney Maine sensation intact via light touch bilateral.  Musculoskeletal:  Mild tenderness with palpation at fifth metatarsal phalangeal joints and tailor's bunion right greater than left,No pain with calf compression bilateral.  There is pain with inversion of the forefoot and direct pressure along the fifth metatarsal phalangeal joints bilateral, strength within normal limits in all groups bilateral.   Assesement and Plan: Problem List Items Addressed This Visit    None    Visit Diagnoses    Ingrown nail    -  Primary  S/P nail surgery       Tailor's bunionette, bilateral       Capsulitis       Bilateral foot pain          -Examined patient  -Cleansed left hallux medial nail fold and gently scrubbed with peroxide and q-tip/curetted away eschar at site and applied antibiotic cream covered with bandaid.  -Discussed plan of care with patient. -Patient to continue soaking in a weak solution of Epsom salt and warm water. Patient was instructed to soak for 15-20 minutes each day until the toe appears normal and there is  no drainage, redness, tenderness, or swelling at the procedure site, and apply neosporin and a gauze or bandaid dressing each day as needed. Juan leave open to air at night. -Educated patient on long term care after nail surgery. -Patient was instructed to monitor the toe for reoccurrence and signs of infection; Patient advised to return to office or go to ER if toe becomes red, hot or swollen. -For tailor's bunions dispense offloading tube foam padding to use daily -Advised patient if bunions continue to be painful to consider custom molded orthotics or surgery; patient to discuss with wife and to think about these options if patient desires custom insoles patient will notify our office and we will arrange for casting appointment. -Patient is to return as needed or sooner if problems arise.  Landis Martins, DPM

## 2017-06-14 DIAGNOSIS — Z79899 Other long term (current) drug therapy: Secondary | ICD-10-CM | POA: Diagnosis not present

## 2017-06-24 DIAGNOSIS — R0602 Shortness of breath: Secondary | ICD-10-CM | POA: Diagnosis not present

## 2017-06-24 DIAGNOSIS — J029 Acute pharyngitis, unspecified: Secondary | ICD-10-CM | POA: Diagnosis not present

## 2017-06-24 DIAGNOSIS — R05 Cough: Secondary | ICD-10-CM | POA: Diagnosis not present

## 2017-07-04 DIAGNOSIS — R69 Illness, unspecified: Secondary | ICD-10-CM | POA: Diagnosis not present

## 2017-08-15 DIAGNOSIS — R69 Illness, unspecified: Secondary | ICD-10-CM | POA: Diagnosis not present

## 2017-09-18 DIAGNOSIS — M25511 Pain in right shoulder: Secondary | ICD-10-CM | POA: Diagnosis not present

## 2017-09-27 DIAGNOSIS — Z8249 Family history of ischemic heart disease and other diseases of the circulatory system: Secondary | ICD-10-CM | POA: Diagnosis not present

## 2017-09-27 DIAGNOSIS — Z6831 Body mass index (BMI) 31.0-31.9, adult: Secondary | ICD-10-CM | POA: Diagnosis not present

## 2017-09-27 DIAGNOSIS — G8929 Other chronic pain: Secondary | ICD-10-CM | POA: Diagnosis not present

## 2017-09-27 DIAGNOSIS — E669 Obesity, unspecified: Secondary | ICD-10-CM | POA: Diagnosis not present

## 2017-09-27 DIAGNOSIS — R69 Illness, unspecified: Secondary | ICD-10-CM | POA: Diagnosis not present

## 2017-09-27 DIAGNOSIS — F419 Anxiety disorder, unspecified: Secondary | ICD-10-CM | POA: Diagnosis not present

## 2017-09-27 DIAGNOSIS — Z833 Family history of diabetes mellitus: Secondary | ICD-10-CM | POA: Diagnosis not present

## 2017-09-27 DIAGNOSIS — Z791 Long term (current) use of non-steroidal anti-inflammatories (NSAID): Secondary | ICD-10-CM | POA: Diagnosis not present

## 2017-10-29 DIAGNOSIS — R69 Illness, unspecified: Secondary | ICD-10-CM | POA: Diagnosis not present

## 2018-01-04 ENCOUNTER — Ambulatory Visit: Payer: Medicare HMO | Admitting: Sports Medicine

## 2018-01-23 ENCOUNTER — Ambulatory Visit: Payer: Medicare HMO | Admitting: Sports Medicine

## 2018-01-23 ENCOUNTER — Encounter: Payer: Self-pay | Admitting: Sports Medicine

## 2018-01-23 DIAGNOSIS — M79675 Pain in left toe(s): Secondary | ICD-10-CM

## 2018-01-23 DIAGNOSIS — L600 Ingrowing nail: Secondary | ICD-10-CM

## 2018-01-23 MED ORDER — NEOMYCIN-POLYMYXIN-HC 3.5-10000-1 OT SOLN: OTIC | 0 refills | Status: DC

## 2018-01-23 NOTE — Progress Notes (Signed)
Subjective: Juan Shepard is a 67 y.o. male patient returns to office today for follow up evaluation after having Left Hallux medial permanent nail avulsion performed by Dr. March Rummage on 05-22-17. Patient reports that when he tries to workout he is getting soreness again over the left hallux at the medial margin and is concerned for recurrence of ingrown toenail since it hurts with pressure with shoes and hurts with significant activity like exercise.  Patient reports he has been soaking using epsom salt and applying topical antibiotic covered with bandaid daily over the last few days as it is been sore. Patient deniesfever/chills/nausea/vomitting/any other related constitutional symptoms at this time.  Patient Active Problem List   Diagnosis Date Noted  . Depression     Current Outpatient Medications on File Prior to Visit  Medication Sig Dispense Refill  . ARIPiprazole (ABILIFY) 5 MG tablet Take 5 mg by mouth daily.    Marland Kitchen aspirin 325 MG tablet Take 325 mg by mouth daily.    . celecoxib (CELEBREX) 200 MG capsule TAKE 1 CAPSULE BY MOUTH EVERY DAY FOR ARTHRITIS  3  . Cholecalciferol (VITAMIN D PO) Take by mouth.    . Cyanocobalamin (VITAMIN B 12 PO) Take by mouth.    . divalproex (DEPAKOTE ER) 500 MG 24 hr tablet Take 500 mg by mouth at bedtime.    Marland Kitchen FLUoxetine (PROZAC) 20 MG capsule Take by mouth daily.  2  . LORazepam (ATIVAN) 0.5 MG tablet     . MAGNESIUM CHLORIDE ER PO Take by mouth.    . Multiple Vitamin (MULTIVITAMIN) tablet Take 1 tablet by mouth daily.    . Omega-3 Fatty Acids (FISH OIL PO) Take by mouth.     Current Facility-Administered Medications on File Prior to Visit  Medication Dose Route Frequency Provider Last Rate Last Dose  . triamcinolone acetonide (KENALOG) 10 MG/ML injection 10 mg  10 mg Other Once Landis Martins, DPM        No Known Allergies  Objective:  General: Well developed, nourished, in no acute distress, alert and oriented x3   Dermatology: Skin is warm, dry  and supple bilateral.  Left hallux medial nail bed appears slightly incurvated distally, (-) Erythema. (+) mild edema. (-) serosanguous drainage present. The remaining nails appear unremarkable at this time. There are no other lesions or other signs of infection  present.  Vascular: Dorsalis Pedis and Posterior Tibial pedal pulses palpable, Capillary Fill Time 3 seconds,(+) pedal hair growth bilateral, focal edema bilateral fifth metatarsophalangeal joints, Temperature gradient mildly decreased.  Neurology: Gross sensation intact via light touch bilateral.  Musculoskeletal:  Minimal tenderness to palpation at the distal nail edge at the medial margin of the left hallux without signs of infection, unchanged tailor's bunions bilateral.  No other acute findings strength within normal limits bilateral. Assesement and Plan: Problem List Items Addressed This Visit    None    Visit Diagnoses    Ingrown nail    -  Primary   Relevant Medications   neomycin-polymyxin-hydrocortisone (CORTISPORIN) OTIC solution   Toe pain, left          -Examined patient  -Cleansed left hallux medial nail fold and gently scrubbed with peroxide and at no charge using a sterile nail nipper and a slight back fashion move the offending margin without discomfort.  Applied antibiotic cream and Band-Aid -Discussed plan of care with patient. -Patient to continue soaking in a weak solution of Epsom salt and warm water for 1 week and prescribed Corticosporin solution  to use until completed twice daily -Advised patient to resume to activities to tolerance and if continues to be problematic or painful patient will wart a repeat nail procedure and advised patient that likely a little more nail may need to be taken to avoid this area healing back with recurrence of the ingrown  -Patient to return to office as needed or sooner if problems or issues arise.  Landis Martins, DPM

## 2018-01-24 DIAGNOSIS — R69 Illness, unspecified: Secondary | ICD-10-CM | POA: Diagnosis not present

## 2018-01-28 DIAGNOSIS — R69 Illness, unspecified: Secondary | ICD-10-CM | POA: Diagnosis not present

## 2018-02-19 DIAGNOSIS — M7061 Trochanteric bursitis, right hip: Secondary | ICD-10-CM | POA: Diagnosis not present

## 2018-03-12 DIAGNOSIS — M5136 Other intervertebral disc degeneration, lumbar region: Secondary | ICD-10-CM | POA: Diagnosis not present

## 2018-03-12 DIAGNOSIS — M9902 Segmental and somatic dysfunction of thoracic region: Secondary | ICD-10-CM | POA: Diagnosis not present

## 2018-03-12 DIAGNOSIS — M9905 Segmental and somatic dysfunction of pelvic region: Secondary | ICD-10-CM | POA: Diagnosis not present

## 2018-03-12 DIAGNOSIS — M9903 Segmental and somatic dysfunction of lumbar region: Secondary | ICD-10-CM | POA: Diagnosis not present

## 2018-03-12 DIAGNOSIS — M7061 Trochanteric bursitis, right hip: Secondary | ICD-10-CM | POA: Diagnosis not present

## 2018-03-12 DIAGNOSIS — S336XXA Sprain of sacroiliac joint, initial encounter: Secondary | ICD-10-CM | POA: Diagnosis not present

## 2018-03-18 DIAGNOSIS — M9902 Segmental and somatic dysfunction of thoracic region: Secondary | ICD-10-CM | POA: Diagnosis not present

## 2018-03-18 DIAGNOSIS — S336XXA Sprain of sacroiliac joint, initial encounter: Secondary | ICD-10-CM | POA: Diagnosis not present

## 2018-03-18 DIAGNOSIS — M5136 Other intervertebral disc degeneration, lumbar region: Secondary | ICD-10-CM | POA: Diagnosis not present

## 2018-03-18 DIAGNOSIS — M9905 Segmental and somatic dysfunction of pelvic region: Secondary | ICD-10-CM | POA: Diagnosis not present

## 2018-03-18 DIAGNOSIS — M7061 Trochanteric bursitis, right hip: Secondary | ICD-10-CM | POA: Diagnosis not present

## 2018-03-18 DIAGNOSIS — M9903 Segmental and somatic dysfunction of lumbar region: Secondary | ICD-10-CM | POA: Diagnosis not present

## 2018-05-01 DIAGNOSIS — R69 Illness, unspecified: Secondary | ICD-10-CM | POA: Diagnosis not present

## 2018-05-06 DIAGNOSIS — Z0000 Encounter for general adult medical examination without abnormal findings: Secondary | ICD-10-CM | POA: Diagnosis not present

## 2018-05-06 DIAGNOSIS — Z6832 Body mass index (BMI) 32.0-32.9, adult: Secondary | ICD-10-CM | POA: Diagnosis not present

## 2018-05-06 DIAGNOSIS — Z5181 Encounter for therapeutic drug level monitoring: Secondary | ICD-10-CM | POA: Diagnosis not present

## 2018-05-06 DIAGNOSIS — I480 Paroxysmal atrial fibrillation: Secondary | ICD-10-CM | POA: Diagnosis not present

## 2018-05-06 DIAGNOSIS — Z86010 Personal history of colonic polyps: Secondary | ICD-10-CM | POA: Diagnosis not present

## 2018-05-06 DIAGNOSIS — R2689 Other abnormalities of gait and mobility: Secondary | ICD-10-CM | POA: Diagnosis not present

## 2018-05-06 DIAGNOSIS — E781 Pure hyperglyceridemia: Secondary | ICD-10-CM | POA: Diagnosis not present

## 2018-05-06 DIAGNOSIS — Z1331 Encounter for screening for depression: Secondary | ICD-10-CM | POA: Diagnosis not present

## 2018-05-23 NOTE — Progress Notes (Signed)
Juan Shepard was seen today in the movement disorders clinic for neurologic consultation at the request of Mateo Flow, MD.  The consultation is for the evaluation of parkinsonism, felt likely related to medication, but consult was requested to rule out anything else. This patient is accompanied in the office by his spouse who supplements the history. Records are reviewed that are made available to me.  Patient has a long history of bipolar disorder, for which he has been on quetiapine in 2016.  In 2018, he was on Zyprexa.  Most recently, he has been on Abilify, which was recently decreased because of daytime hypersomnolence.    Specific Symptoms:  Tremor: Yes.   , bilateral UE, L>R - most when using the hand.  Started a few years ago.   Family hx of similar:  No. Voice: softer per patient/wife Sleep: sleeps well  Vivid Dreams:  Yes.    Acting out dreams:  No. Wet Pillows: No. Postural symptoms:  Yes.    Falls?  No. Bradykinesia symptoms: shuffling gait, slow movements and difficulty getting out of a chair Loss of smell:  No. Loss of taste:  No. Urinary Incontinence:  No. Difficulty Swallowing:  No. Handwriting, micrographia: Yes.   Trouble with ADL's:  No. but is slower and it is harder to control  Trouble buttoning clothing: No. Depression:  Yes.   but well controlled with medication Memory changes:  Yes.  , but just for names.  Memory for other things is good Hallucinations:  No.  visual distortions: No. N/V:  No. Lightheaded:  No.  Syncope: No. Diplopia:  No. Dyskinesia:  No. Prior exposure to reglan/antipsychotics: Yes.    Neuroimaging of the brain has not previously been performed.      ALLERGIES:  No Known Allergies  CURRENT MEDICATIONS:  Outpatient Encounter Medications as of 05/27/2018  Medication Sig  . ARIPiprazole (ABILIFY) 5 MG tablet Take 5 mg by mouth daily.  Marland Kitchen aspirin 325 MG tablet Take 325 mg by mouth daily.  . celecoxib (CELEBREX) 200 MG  capsule TAKE 1 CAPSULE BY MOUTH EVERY DAY FOR ARTHRITIS  . Cholecalciferol (VITAMIN D PO) Take by mouth.  . Cyanocobalamin (VITAMIN B 12 PO) Take by mouth.  . divalproex (DEPAKOTE ER) 500 MG 24 hr tablet Take 500 mg by mouth at bedtime.  Marland Kitchen FLUoxetine (PROZAC) 20 MG capsule Take 40 mg by mouth daily.   Marland Kitchen LORazepam (ATIVAN) 0.5 MG tablet   . MAGNESIUM CHLORIDE ER PO Take by mouth.  . Multiple Vitamin (MULTIVITAMIN) tablet Take 1 tablet by mouth daily.  Marland Kitchen neomycin-polymyxin-hydrocortisone (CORTISPORIN) OTIC solution Apply twice daily to toe until all gone  . Omega-3 Fatty Acids (FISH OIL PO) Take by mouth.  . [DISCONTINUED] triamcinolone acetonide (KENALOG) 10 MG/ML injection 10 mg    No facility-administered encounter medications on file as of 05/27/2018.     PAST MEDICAL HISTORY:   Past Medical History:  Diagnosis Date  . Anxiety   . Arthritis   . Bipolar disorder ([place])   . BPH (benign prostatic hyperplasia)   . Depression   . Dysrhythmia    PAF 01-2015, saw Dr Jimmie Molly  . GERD (gastroesophageal reflux disease)   . Tenosynovitis of fingers    LMF    PAST SURGICAL HISTORY:   Past Surgical History:  Procedure Laterality Date  . TENOSYNOVECTOMY Left 07/15/2015   Procedure: TENOSYNOVECTOMY LEFT FLEXORS LEFT MIDDLE FINGER;  Surgeon: Daryll Brod, MD;  Location: [facility];  Service: Orthopedics;  Laterality: Left;  . TONSILLECTOMY      SOCIAL HISTORY:   Social History   Socioeconomic History  . Marital status: Married    Spouse name: Not on file  . Number of children: Not on file  . Years of education: Not on file  . Highest education level: Not on file  Occupational History  . Not on file  Social Needs  . Financial resource strain: Not on file  . Food insecurity:    Worry: Not on file    Inability: Not on file  . Transportation needs:    Medical: Not on file    Non-medical: Not on file  Tobacco Use  . Smoking status: Never Smoker  . Smokeless tobacco:  Never Used  Substance and Sexual Activity  . Alcohol use: No  . Drug use: No  . Sexual activity: Not on file  Lifestyle  . Physical activity:    Days per week: Not on file    Minutes per session: Not on file  . Stress: Not on file  Relationships  . Social connections:    Talks on phone: Not on file    Gets together: Not on file    Attends religious service: Not on file    Active member of club or organization: Not on file    Attends meetings of clubs or organizations: Not on file    Relationship status: Not on file  . Intimate partner violence:    Fear of current or ex partner: Not on file    Emotionally abused: Not on file    Physically abused: Not on file    Forced sexual activity: Not on file  Other Topics Concern  . Not on file  Social History Narrative  . Not on file    FAMILY HISTORY:   Family Status  Relation Name Status  . Mother  Deceased  . Father  Deceased  . Sister x5 Alive  . Son  Alive    ROS:  Review of Systems  Constitutional: Negative.   HENT: Negative.   Eyes: Negative.   Respiratory: Negative.   Cardiovascular: Negative.   Gastrointestinal: Negative.   Genitourinary: Negative.   Musculoskeletal: Negative.   Skin: Negative.   Endo/Heme/Allergies: Negative.     PHYSICAL EXAMINATION:    VITALS:   Vitals:   05/27/18 0857  BP: 90/64  Pulse: 62  SpO2: 99%  Weight: 233 lb (105.7 kg)  Height: 5\' 11"  (1.803 m)    GEN:  The patient appears stated age and is in NAD. HEENT:  Normocephalic, atraumatic.  The mucous membranes are moist. The superficial temporal arteries are without ropiness or tenderness. CV:  RRR Lungs:  CTAB Neck/HEME:  There are no carotid bruits bilaterally.  Neurological examination:  Orientation: The patient is alert and oriented x3. Fund of knowledge is appropriate.  Recent and remote memory are intact.  Attention and concentration are normal.    Able to name objects and repeat phrases. Cranial nerves: There is good  facial symmetry. There is facial hypomimia.  Pupils are equal round and reactive to light bilaterally. Fundoscopic exam is attempted but the disc margins are not well visualized bilaterally.   Extraocular muscles are intact. The visual fields are full to confrontational testing. The speech is fluent and clear. He is hypophonic.  Soft palate rises symmetrically and there is no tongue deviation. Hearing is intact to conversational tone. Sensation: Sensation is intact to light and pinprick throughout (facial, trunk, extremities). Vibration is intact at  the bilateral big toe. There is no extinction with double simultaneous stimulation. There is no sensory dermatomal level identified. Motor: Strength is 5/5 in the bilateral upper and lower extremities.   Shoulder shrug is equal and symmetric.  There is no pronator drift. Deep tendon reflexes: Deep tendon reflexes are 2/4 at the bilateral biceps, triceps, brachioradialis, patella and achilles. Plantar responses are downgoing bilaterally.  Movement examination: Tone: There is mild tone in the bilateral upper extremities.  The tone in the lower extremities is normal.  Abnormal movements: very rare tremor of the L hand at rest Coordination:  There is decremation with RAM's, with  alternation of supination/pronation of the forearm bilaterally and heel/toe taps, L more than right. Gait and Station: The patient has arises on the 2nd attempt without the use of the hands. The patient's stride length is decreased with decreased arm swing on the L.  The patient has a negative pull test.      Labs: Patient had lab work dated May 06, 2018.  White blood cells are 8.0, hemoglobin 15.5, hematocrit 46 and platelets 194.  Sodium is 135, potassium 4.2, chloride 98, CO2 31, BUN 13, creatinine 1.2, AST 16, ALT 17.  Valproic acid level was 24.  ASSESSMENT/PLAN:  1.  Parkinsonism  -I had a long counseling session with the patient today.  I discussed with the patient that he  likely has secondary parkinsonism due to abilify (and before that zyprexa).  I explained that one clinically cannot tell the difference between idiopathic parkinsons disease and secondary parkinsonism from medication.  I also explained that even if one is able to get off of the medication, it can take up to 6 months to clinically definitively know if this is idiopathic parkinsons disease.  I did not advise that the patient go off of medication, as this needs to be discussed with the patients prescribing physician, who is a psychiatrist at [facility].  I did, however, tell the patient that the longer one is on the medication, the worse the symptoms can get.  The patient has an appt on Monday with his psychiatrist and states that he will discuss what I have discussed with him.  He will not stop the Abilify on his own.  IF he is able to to get off of the atypical antipsychotics, then I would like to see him back 6 months after that to make sure that symptoms have resolved.  He expressed understanding.  Greater than 50% of the 45-minute visit was spent in counseling with the patient and his wife.   Cc:  Mateo Flow, MD

## 2018-05-27 ENCOUNTER — Ambulatory Visit: Payer: Medicare HMO | Admitting: Neurology

## 2018-05-27 ENCOUNTER — Encounter: Payer: Self-pay | Admitting: Neurology

## 2018-05-27 VITALS — BP 90/64 | HR 62 | Ht 71.0 in | Wt 233.0 lb

## 2018-05-27 DIAGNOSIS — G2111 Neuroleptic induced parkinsonism: Secondary | ICD-10-CM

## 2018-05-27 NOTE — Progress Notes (Signed)
Note faxed to Kennyth Lose at [facility] fax number [phone].

## 2018-06-03 DIAGNOSIS — R69 Illness, unspecified: Secondary | ICD-10-CM | POA: Diagnosis not present

## 2018-07-16 DIAGNOSIS — R69 Illness, unspecified: Secondary | ICD-10-CM | POA: Diagnosis not present

## 2018-11-27 DIAGNOSIS — R1111 Vomiting without nausea: Secondary | ICD-10-CM | POA: Diagnosis not present

## 2018-11-27 DIAGNOSIS — R531 Weakness: Secondary | ICD-10-CM | POA: Diagnosis not present

## 2018-11-27 DIAGNOSIS — I10 Essential (primary) hypertension: Secondary | ICD-10-CM | POA: Diagnosis not present

## 2018-11-27 DIAGNOSIS — H538 Other visual disturbances: Secondary | ICD-10-CM | POA: Diagnosis not present

## 2018-11-27 DIAGNOSIS — R200 Anesthesia of skin: Secondary | ICD-10-CM | POA: Diagnosis not present

## 2018-11-27 DIAGNOSIS — R69 Illness, unspecified: Secondary | ICD-10-CM | POA: Diagnosis not present

## 2018-11-27 DIAGNOSIS — N179 Acute kidney failure, unspecified: Secondary | ICD-10-CM | POA: Diagnosis not present

## 2018-11-27 DIAGNOSIS — D72829 Elevated white blood cell count, unspecified: Secondary | ICD-10-CM | POA: Diagnosis not present

## 2018-11-27 DIAGNOSIS — E860 Dehydration: Secondary | ICD-10-CM | POA: Diagnosis not present

## 2018-11-27 DIAGNOSIS — R42 Dizziness and giddiness: Secondary | ICD-10-CM | POA: Diagnosis not present

## 2018-11-27 DIAGNOSIS — Z79899 Other long term (current) drug therapy: Secondary | ICD-10-CM | POA: Diagnosis not present

## 2018-11-27 DIAGNOSIS — G459 Transient cerebral ischemic attack, unspecified: Secondary | ICD-10-CM | POA: Diagnosis not present

## 2018-11-27 DIAGNOSIS — E785 Hyperlipidemia, unspecified: Secondary | ICD-10-CM | POA: Diagnosis not present

## 2018-11-28 DIAGNOSIS — N179 Acute kidney failure, unspecified: Secondary | ICD-10-CM | POA: Diagnosis not present

## 2018-11-28 DIAGNOSIS — F319 Bipolar disorder, unspecified: Secondary | ICD-10-CM | POA: Diagnosis not present

## 2018-11-28 DIAGNOSIS — I340 Nonrheumatic mitral (valve) insufficiency: Secondary | ICD-10-CM

## 2018-11-28 DIAGNOSIS — I6523 Occlusion and stenosis of bilateral carotid arteries: Secondary | ICD-10-CM | POA: Diagnosis not present

## 2018-11-28 DIAGNOSIS — F329 Major depressive disorder, single episode, unspecified: Secondary | ICD-10-CM

## 2018-11-28 DIAGNOSIS — D72829 Elevated white blood cell count, unspecified: Secondary | ICD-10-CM | POA: Diagnosis not present

## 2018-11-28 DIAGNOSIS — R200 Anesthesia of skin: Secondary | ICD-10-CM | POA: Diagnosis not present

## 2018-11-28 DIAGNOSIS — E860 Dehydration: Secondary | ICD-10-CM | POA: Diagnosis not present

## 2018-11-28 DIAGNOSIS — G459 Transient cerebral ischemic attack, unspecified: Secondary | ICD-10-CM

## 2018-11-28 DIAGNOSIS — I6503 Occlusion and stenosis of bilateral vertebral arteries: Secondary | ICD-10-CM | POA: Diagnosis not present

## 2018-11-28 DIAGNOSIS — R69 Illness, unspecified: Secondary | ICD-10-CM | POA: Diagnosis not present

## 2018-12-06 DIAGNOSIS — Z6830 Body mass index (BMI) 30.0-30.9, adult: Secondary | ICD-10-CM | POA: Diagnosis not present

## 2018-12-06 DIAGNOSIS — E782 Mixed hyperlipidemia: Secondary | ICD-10-CM | POA: Diagnosis not present

## 2018-12-06 DIAGNOSIS — I480 Paroxysmal atrial fibrillation: Secondary | ICD-10-CM | POA: Diagnosis not present

## 2018-12-06 DIAGNOSIS — R29818 Other symptoms and signs involving the nervous system: Secondary | ICD-10-CM | POA: Diagnosis not present

## 2018-12-08 DIAGNOSIS — G459 Transient cerebral ischemic attack, unspecified: Secondary | ICD-10-CM | POA: Diagnosis not present

## 2018-12-08 DIAGNOSIS — R1111 Vomiting without nausea: Secondary | ICD-10-CM | POA: Diagnosis not present

## 2018-12-08 DIAGNOSIS — I4891 Unspecified atrial fibrillation: Secondary | ICD-10-CM

## 2018-12-08 DIAGNOSIS — R112 Nausea with vomiting, unspecified: Secondary | ICD-10-CM | POA: Diagnosis not present

## 2018-12-08 DIAGNOSIS — R69 Illness, unspecified: Secondary | ICD-10-CM | POA: Diagnosis not present

## 2018-12-08 DIAGNOSIS — R55 Syncope and collapse: Secondary | ICD-10-CM | POA: Diagnosis not present

## 2018-12-08 DIAGNOSIS — Z79899 Other long term (current) drug therapy: Secondary | ICD-10-CM | POA: Diagnosis not present

## 2018-12-08 DIAGNOSIS — I10 Essential (primary) hypertension: Secondary | ICD-10-CM

## 2018-12-08 DIAGNOSIS — I493 Ventricular premature depolarization: Secondary | ICD-10-CM | POA: Diagnosis not present

## 2018-12-08 DIAGNOSIS — R42 Dizziness and giddiness: Secondary | ICD-10-CM | POA: Diagnosis not present

## 2018-12-08 DIAGNOSIS — R531 Weakness: Secondary | ICD-10-CM | POA: Diagnosis not present

## 2018-12-08 DIAGNOSIS — Z8673 Personal history of transient ischemic attack (TIA), and cerebral infarction without residual deficits: Secondary | ICD-10-CM | POA: Diagnosis not present

## 2018-12-08 DIAGNOSIS — R110 Nausea: Secondary | ICD-10-CM | POA: Diagnosis not present

## 2018-12-08 DIAGNOSIS — E872 Acidosis: Secondary | ICD-10-CM | POA: Diagnosis not present

## 2018-12-08 DIAGNOSIS — F419 Anxiety disorder, unspecified: Secondary | ICD-10-CM | POA: Diagnosis not present

## 2018-12-08 DIAGNOSIS — F319 Bipolar disorder, unspecified: Secondary | ICD-10-CM | POA: Diagnosis not present

## 2018-12-09 DIAGNOSIS — R112 Nausea with vomiting, unspecified: Secondary | ICD-10-CM | POA: Diagnosis not present

## 2018-12-09 DIAGNOSIS — F319 Bipolar disorder, unspecified: Secondary | ICD-10-CM | POA: Diagnosis not present

## 2018-12-09 DIAGNOSIS — R69 Illness, unspecified: Secondary | ICD-10-CM | POA: Diagnosis not present

## 2018-12-09 DIAGNOSIS — R55 Syncope and collapse: Secondary | ICD-10-CM | POA: Diagnosis not present

## 2018-12-09 DIAGNOSIS — I10 Essential (primary) hypertension: Secondary | ICD-10-CM | POA: Diagnosis not present

## 2018-12-09 DIAGNOSIS — E872 Acidosis: Secondary | ICD-10-CM | POA: Diagnosis not present

## 2018-12-09 DIAGNOSIS — I4891 Unspecified atrial fibrillation: Secondary | ICD-10-CM | POA: Diagnosis not present

## 2018-12-10 DIAGNOSIS — R42 Dizziness and giddiness: Secondary | ICD-10-CM | POA: Diagnosis not present

## 2018-12-10 DIAGNOSIS — R262 Difficulty in walking, not elsewhere classified: Secondary | ICD-10-CM | POA: Diagnosis not present

## 2018-12-10 DIAGNOSIS — R202 Paresthesia of skin: Secondary | ICD-10-CM | POA: Diagnosis not present

## 2018-12-10 DIAGNOSIS — R200 Anesthesia of skin: Secondary | ICD-10-CM | POA: Diagnosis not present

## 2018-12-10 DIAGNOSIS — R251 Tremor, unspecified: Secondary | ICD-10-CM | POA: Diagnosis not present

## 2018-12-10 DIAGNOSIS — R112 Nausea with vomiting, unspecified: Secondary | ICD-10-CM | POA: Diagnosis not present

## 2018-12-10 DIAGNOSIS — R4781 Slurred speech: Secondary | ICD-10-CM | POA: Diagnosis not present

## 2018-12-10 DIAGNOSIS — R69 Illness, unspecified: Secondary | ICD-10-CM | POA: Diagnosis not present

## 2018-12-12 ENCOUNTER — Encounter: Payer: Self-pay | Admitting: Cardiology

## 2018-12-12 ENCOUNTER — Other Ambulatory Visit: Payer: Self-pay

## 2018-12-12 ENCOUNTER — Ambulatory Visit (INDEPENDENT_AMBULATORY_CARE_PROVIDER_SITE_OTHER): Payer: Medicare HMO | Admitting: Cardiology

## 2018-12-12 ENCOUNTER — Ambulatory Visit (INDEPENDENT_AMBULATORY_CARE_PROVIDER_SITE_OTHER): Payer: Medicare HMO

## 2018-12-12 DIAGNOSIS — E785 Hyperlipidemia, unspecified: Secondary | ICD-10-CM | POA: Insufficient documentation

## 2018-12-12 DIAGNOSIS — I480 Paroxysmal atrial fibrillation: Secondary | ICD-10-CM

## 2018-12-12 DIAGNOSIS — R55 Syncope and collapse: Secondary | ICD-10-CM

## 2018-12-12 DIAGNOSIS — I739 Peripheral vascular disease, unspecified: Secondary | ICD-10-CM

## 2018-12-12 HISTORY — DX: Hyperlipidemia, unspecified: E78.5

## 2018-12-12 HISTORY — DX: Paroxysmal atrial fibrillation: I48.0

## 2018-12-12 HISTORY — DX: Peripheral vascular disease, unspecified: I73.9

## 2018-12-12 HISTORY — DX: Syncope and collapse: R55

## 2018-12-12 NOTE — Patient Instructions (Signed)
Medication Instructions:  Your physician recommends that you continue on your current medications as directed. Please refer to the Current Medication list given to you today.  If you need a refill on your cardiac medications before your next appointment, please call your pharmacy.   Lab work: None.  If you have labs (blood work) drawn today and your tests are completely normal, you will receive your results only by: . MyChart Message (if you have MyChart) OR . A paper copy in the mail If you have any lab test that is abnormal or we need to change your treatment, we will call you to review the results.  Testing/Procedures: Your physician has recommended that you wear a holter monitor. Holter monitors are medical devices that record the heart's electrical activity. Doctors most often use these monitors to diagnose arrhythmias. Arrhythmias are problems with the speed or rhythm of the heartbeat. The monitor is a small, portable device. You can wear one while you do your normal daily activities. This is usually used to diagnose what is causing palpitations/syncope (passing out). Wear for 7 days.     Follow-Up: At [facility], you and your health needs are our priority.  As part of our continuing mission to provide you with exceptional heart care, we have created designated Provider Care Teams.  These Care Teams include your primary Cardiologist (physician) and Advanced Practice Providers (APPs -  Physician Assistants and Nurse Practitioners) who all work together to provide you with the care you need, when you need it. You will need a follow up appointment in 1 months.  Please call our office 2 months in advance to schedule this appointment.  You may see No primary care provider on file. or another member of our [facility] Provider Team in [place]: Brian Munley, MD . Rajan Revankar, MD  Any Other Special Instructions Will Be Listed Below (If Applicable).     

## 2018-12-12 NOTE — Progress Notes (Signed)
Cardiology Consultation:    Date:  12/12/2018   ID:  Juan Shepard, DOB 07/09/1950, MRN [id number]  PCP:  Juan Flow, MD  Cardiologist:  Juan Campus, MD   Referring MD: Juan Flow, MD   Chief Complaint  Patient presents with  . Follow-up    History of Present Illness:    Juan Shepard is a 68 y.o. male who is being seen today for the evaluation of I passed out at the request of Juan Flow, MD.  [facility] he [facility].  The reason for admission was episode of syncope he described total of 3 episodes of syncope.  One episode happened when he was sitting and talking to his friends.  He was sitting on the chair started feeling sweaty and nauseated and then shortly after that he passed out.  He was unconscious probably for split-second to the point when he fell to the on the ground he was already awake.  He had to stay for a few minutes on the floor until he was completely fine to the point that he was able to get up.  He had a going to the hospital quite extensive evaluation has been done which included echocardiogram showing preserved ejection fraction.  Carotid ultrasound was done which showed a 50% blockage on one side and his internal coronary artery completely occluded vertebral artery.  He described 2 similar episodes every single time episode of syncope was preceded by feeling nauseated twice he threw up before he passed out.  At the same time he is able to walk climb stairs with no difficulties.  He does not have any shortness of breath no chest pain tightness squeezing pressure branches overall seems to be doing well otherwise.  Past Medical History:  Diagnosis Date  . Anxiety   . Arthritis   . Bipolar disorder ([place])   . BPH (benign prostatic hyperplasia)   . Depression   . Dysrhythmia    PAF 01-2015, saw Dr Juan Shepard  . GERD (gastroesophageal reflux disease)   . Tenosynovitis of fingers    LMF    Past Surgical History:  Procedure  Laterality Date  . TENOSYNOVECTOMY Left 07/15/2015   Procedure: TENOSYNOVECTOMY LEFT FLEXORS LEFT MIDDLE FINGER;  Surgeon: Daryll Brod, MD;  Location: [facility];  Service: Orthopedics;  Laterality: Left;  . TONSILLECTOMY    . VASECTOMY      Current Medications: Current Meds  Medication Sig  . amLODipine (NORVASC) 5 MG tablet Take 5 mg by mouth daily.  . ARIPiprazole (ABILIFY) 10 MG tablet Take 10 mg by mouth daily.   Marland Kitchen aspirin 81 MG EC tablet Take 81 mg by mouth daily.   Marland Kitchen atorvastatin (LIPITOR) 80 MG tablet Take 80 mg by mouth daily.  . celecoxib (CELEBREX) 200 MG capsule TAKE 1 CAPSULE BY MOUTH EVERY DAY FOR ARTHRITIS  . Cholecalciferol (VITAMIN D PO) Take by mouth.  . clopidogrel (PLAVIX) 75 MG tablet Take 75 mg by mouth daily.  . Cyanocobalamin (VITAMIN B 12 PO) Take by mouth.  . divalproex (DEPAKOTE ER) 500 MG 24 hr tablet Take 500 mg by mouth at bedtime.  Marland Kitchen FLUoxetine (PROZAC) 20 MG capsule Take 40 mg by mouth daily.   Marland Kitchen LORazepam (ATIVAN) 0.5 MG tablet   . MAGNESIUM CHLORIDE ER PO Take by mouth.  . Multiple Vitamin (MULTIVITAMIN) tablet Take 1 tablet by mouth daily.  . Omega-3 Fatty Acids (FISH OIL PO) Take by mouth.  Allergies:   Patient has no known allergies.   Social History   Socioeconomic History  . Marital status: Married    Spouse name: Not on file  . Number of children: Not on file  . Years of education: Not on file  . Highest education level: Not on file  Occupational History  . Not on file  Social Needs  . Financial resource strain: Not on file  . Food insecurity    Worry: Not on file    Inability: Not on file  . Transportation needs    Medical: Not on file    Non-medical: Not on file  Tobacco Use  . Smoking status: Never Smoker  . Smokeless tobacco: Never Used  Substance and Sexual Activity  . Alcohol use: No  . Drug use: No  . Sexual activity: Not on file  Lifestyle  . Physical activity    Days per week: Not on file     Minutes per session: Not on file  . Stress: Not on file  Relationships  . Social Herbalist on phone: Not on file    Gets together: Not on file    Attends religious service: Not on file    Active member of club or organization: Not on file    Attends meetings of clubs or organizations: Not on file    Relationship status: Not on file  Other Topics Concern  . Not on file  Social History Narrative  . Not on file     Family History: The patient's family history includes Arthritis in his mother; Diabetes in his father; Healthy in his sister and son; Pancreatitis in his mother. ROS:   Please see the history of present illness.    All 14 point review of systems negative except as described per history of present illness.  EKGs/Labs/Other Studies Reviewed:    The following studies were reviewed today: EKG done in the hospital showed sinus bradycardia rate of 55.  Left axis deviation, incomplete right bundle branch block.  Nonspecific ST segment changes.   Recent Labs: No results found for requested labs within last 8760 hours.  Recent Lipid Panel No results found for: CHOL, TRIG, HDL, CHOLHDL, VLDL, LDLCALC, LDLDIRECT  Physical Exam:    VS:  BP 100/62   Pulse (!) 58   Ht 5\' 11"  (1.803 m)   Wt 219 lb 1.9 oz (99.4 kg)   SpO2 98%   BMI 30.56 kg/m     Wt Readings from Last 3 Encounters:  12/12/18 219 lb 1.9 oz (99.4 kg)  05/27/18 233 lb (105.7 kg)  05/04/17 214 lb (97.1 kg)     GEN:  Well nourished, well developed in no acute distress HEENT: Normal NECK: No JVD; No carotid bruits LYMPHATICS: No lymphadenopathy CARDIAC: RRR, no murmurs, no rubs, no gallops RESPIRATORY:  Clear to auscultation without rales, wheezing or rhonchi  ABDOMEN: Soft, non-tender, non-distended MUSCULOSKELETAL:  No edema; No deformity  SKIN: Warm and dry NEUROLOGIC:  Alert and oriented x 3 PSYCHIATRIC:  Normal affect   ASSESSMENT:    1. Paroxysmal atrial fibrillation (HCC)   2.  Syncope and collapse   3. Peripheral vascular disease, unspecified ([place])   4. Dyslipidemia    PLAN:    In order of problems listed above:  1. Paroxysmal atrial fibrillation.  Apparently 3 years ago he was seen in our office in [place] because of proximal atrial fibrillation.  There was only one documented episode.  He was completely asymptomatic  when he had it.  No anticoagulation no medications has been given.  Obviously now with his episode of passing out concerns about possibility of rhythm problem that is responsible for his syncope. 2. Syncope does have some vasovagal characteristics which include nausea and sweaty and vomiting before he passed out but with history of paroxysmal atrial fibrillation and concerns about potentially having some arrhythmia either tachycardia or significant bradycardia.  We will put a 0 patch on him make sure that this is not the case.  Likely his echocardiogram showed preserved left ventricular ejection fraction. 3. Peripheral vascular disease.  Here he noted to have 50% internal carotid artery stenosis 1 side.  We will continue Plavix as well as rest medications. 4. Dyslipidemia.  Will try to convince him to use a statin.     Medication Adjustments/Labs and Tests Ordered: Current medicines are reviewed at length with the patient today.  Concerns regarding medicines are outlined above.  No orders of the defined types were placed in this encounter.  No orders of the defined types were placed in this encounter.   Signed, Park Liter, MD, [facility]. 12/12/2018 3:02 PM    [facility]

## 2018-12-12 NOTE — Addendum Note (Signed)
Addended by: Ashok Norris on: 12/12/2018 03:09 PM   Modules accepted: Orders

## 2018-12-26 DIAGNOSIS — R55 Syncope and collapse: Secondary | ICD-10-CM | POA: Diagnosis not present

## 2018-12-30 DIAGNOSIS — Z79899 Other long term (current) drug therapy: Secondary | ICD-10-CM | POA: Diagnosis not present

## 2018-12-30 DIAGNOSIS — I739 Peripheral vascular disease, unspecified: Secondary | ICD-10-CM | POA: Diagnosis not present

## 2018-12-30 DIAGNOSIS — R69 Illness, unspecified: Secondary | ICD-10-CM | POA: Diagnosis not present

## 2018-12-30 DIAGNOSIS — Z6830 Body mass index (BMI) 30.0-30.9, adult: Secondary | ICD-10-CM | POA: Diagnosis not present

## 2018-12-30 DIAGNOSIS — I480 Paroxysmal atrial fibrillation: Secondary | ICD-10-CM | POA: Diagnosis not present

## 2018-12-30 DIAGNOSIS — R42 Dizziness and giddiness: Secondary | ICD-10-CM | POA: Diagnosis not present

## 2018-12-30 DIAGNOSIS — I10 Essential (primary) hypertension: Secondary | ICD-10-CM | POA: Diagnosis not present

## 2019-01-01 DIAGNOSIS — I4891 Unspecified atrial fibrillation: Secondary | ICD-10-CM | POA: Diagnosis not present

## 2019-01-01 DIAGNOSIS — K219 Gastro-esophageal reflux disease without esophagitis: Secondary | ICD-10-CM | POA: Diagnosis not present

## 2019-01-01 DIAGNOSIS — R110 Nausea: Secondary | ICD-10-CM | POA: Diagnosis not present

## 2019-01-01 DIAGNOSIS — M1990 Unspecified osteoarthritis, unspecified site: Secondary | ICD-10-CM | POA: Diagnosis not present

## 2019-01-01 DIAGNOSIS — R42 Dizziness and giddiness: Secondary | ICD-10-CM | POA: Diagnosis not present

## 2019-01-01 DIAGNOSIS — R531 Weakness: Secondary | ICD-10-CM | POA: Diagnosis not present

## 2019-01-01 DIAGNOSIS — E7800 Pure hypercholesterolemia, unspecified: Secondary | ICD-10-CM | POA: Diagnosis not present

## 2019-01-03 ENCOUNTER — Encounter: Payer: Self-pay | Admitting: Cardiology

## 2019-01-03 ENCOUNTER — Other Ambulatory Visit: Payer: Self-pay

## 2019-01-03 ENCOUNTER — Ambulatory Visit (INDEPENDENT_AMBULATORY_CARE_PROVIDER_SITE_OTHER): Payer: Medicare HMO | Admitting: Cardiology

## 2019-01-03 VITALS — BP 122/66 | HR 96 | Ht 71.0 in | Wt 218.0 lb

## 2019-01-03 DIAGNOSIS — I739 Peripheral vascular disease, unspecified: Secondary | ICD-10-CM

## 2019-01-03 DIAGNOSIS — E785 Hyperlipidemia, unspecified: Secondary | ICD-10-CM

## 2019-01-03 DIAGNOSIS — I480 Paroxysmal atrial fibrillation: Secondary | ICD-10-CM

## 2019-01-03 DIAGNOSIS — R55 Syncope and collapse: Secondary | ICD-10-CM | POA: Diagnosis not present

## 2019-01-03 NOTE — Progress Notes (Signed)
Cardiology Office Note:    Date:  01/03/2019   ID:  Juan Shepard, DOB Feb 08, 1951, MRN [id number]  PCP:  Mateo Flow, MD  Cardiologist:  Jenne Campus, MD    Referring MD: Mateo Flow, MD   Chief Complaint  Patient presents with  . 1 month follow up  I passed out again and after being in the hospital  History of Present Illness:    Juan Shepard is a 68 y.o. male who was referred to [place] originally because of episodes of syncope.  When I see him a month ago he got 3 episodes being syncope one episode happened when he was sitting on the chair.  This time he was walking on the hallway started feeling poorly started feeling nauseated went to his bedroom he was standing next to the bed started feeling like he is passing out he laid down in the bed when he was laying down in the bed he got some double vision also felt very poorly nauseated and tired sensation lasted for about half an hour.  After that he was fine and again was walking on the hallway and similar situation happened he eventually ended up going to the hospital.  This is second visit because of the same problem in [facility].  Previously quite extensive evaluation has been done which included carotic arterial study which showed 50% narrowing on one side.  I put monitor on him trying to figure out if he got any recurrences of atrial fibrillation apparently had one episode of atrial fibrillation years ago.  Monitor did not show any evidence of atrial fibrillation but did show some nonsustained ventricular tachycardia only 4 beats in a row.  Past Medical History:  Diagnosis Date  . Anxiety   . Arthritis   . Bipolar disorder ([place])   . BPH (benign prostatic hyperplasia)   . Depression   . Dysrhythmia    PAF 01-2015, saw Dr Jimmie Molly  . GERD (gastroesophageal reflux disease)   . Tenosynovitis of fingers    LMF    Past Surgical History:  Procedure Laterality Date  . TENOSYNOVECTOMY Left 07/15/2015   Procedure:  TENOSYNOVECTOMY LEFT FLEXORS LEFT MIDDLE FINGER;  Surgeon: Daryll Brod, MD;  Location: [facility];  Service: Orthopedics;  Laterality: Left;  . TONSILLECTOMY    . VASECTOMY      Current Medications: Current Meds  Medication Sig  . amLODipine (NORVASC) 5 MG tablet Take 5 mg by mouth daily.  . ARIPiprazole (ABILIFY) 10 MG tablet Take 10 mg by mouth daily.   Marland Kitchen aspirin 81 MG EC tablet Take 81 mg by mouth daily.   Marland Kitchen atorvastatin (LIPITOR) 80 MG tablet Take 80 mg by mouth daily.  . benztropine (COGENTIN) 1 MG tablet Take 1 mg by mouth 2 (two) times daily.  . celecoxib (CELEBREX) 200 MG capsule TAKE 1 CAPSULE BY MOUTH EVERY DAY FOR ARTHRITIS  . Cholecalciferol (VITAMIN D PO) Take by mouth.  . clopidogrel (PLAVIX) 75 MG tablet Take 75 mg by mouth daily.  . Cyanocobalamin (VITAMIN B 12 PO) Take by mouth.  . divalproex (DEPAKOTE ER) 500 MG 24 hr tablet Take 500 mg by mouth at bedtime.  Marland Kitchen FLUoxetine (PROZAC) 20 MG capsule Take 40 mg by mouth daily.   Marland Kitchen LORazepam (ATIVAN) 0.5 MG tablet   . MAGNESIUM CHLORIDE ER PO Take by mouth.  . Multiple Vitamin (MULTIVITAMIN) tablet Take 1 tablet by mouth daily.  . Omega-3 Fatty Acids (FISH OIL PO) Take by mouth.  Allergies:   Patient has no known allergies.   Social History   Socioeconomic History  . Marital status: Married    Spouse name: Not on file  . Number of children: Not on file  . Years of education: Not on file  . Highest education level: Not on file  Occupational History  . Not on file  Social Needs  . Financial resource strain: Not on file  . Food insecurity    Worry: Not on file    Inability: Not on file  . Transportation needs    Medical: Not on file    Non-medical: Not on file  Tobacco Use  . Smoking status: Never Smoker  . Smokeless tobacco: Never Used  Substance and Sexual Activity  . Alcohol use: No  . Drug use: No  . Sexual activity: Not on file  Lifestyle  . Physical activity    Days per week: Not on  file    Minutes per session: Not on file  . Stress: Not on file  Relationships  . Social Herbalist on phone: Not on file    Gets together: Not on file    Attends religious service: Not on file    Active member of club or organization: Not on file    Attends meetings of clubs or organizations: Not on file    Relationship status: Not on file  Other Topics Concern  . Not on file  Social History Narrative  . Not on file     Family History: The patient's family history includes Arthritis in his mother; Diabetes in his father; Healthy in his sister and son; Pancreatitis in his mother. ROS:   Please see the history of present illness.    All 14 point review of systems negative except as described per history of present illness  EKGs/Labs/Other Studies Reviewed:      Recent Labs: No results found for requested labs within last 8760 hours.  Recent Lipid Panel No results found for: CHOL, TRIG, HDL, CHOLHDL, VLDL, LDLCALC, LDLDIRECT  Physical Exam:    VS:  BP 122/66   Pulse 96   Ht 5\' 11"  (1.803 m)   Wt 218 lb (98.9 kg)   SpO2 99%   BMI 30.40 kg/m     Wt Readings from Last 3 Encounters:  01/03/19 218 lb (98.9 kg)  12/12/18 219 lb 1.9 oz (99.4 kg)  05/27/18 233 lb (105.7 kg)     GEN:  Well nourished, well developed in no acute distress HEENT: Normal NECK: No JVD; No carotid bruits LYMPHATICS: No lymphadenopathy CARDIAC: RRR, no murmurs, no rubs, no gallops RESPIRATORY:  Clear to auscultation without rales, wheezing or rhonchi  ABDOMEN: Soft, non-tender, non-distended MUSCULOSKELETAL:  No edema; No deformity  SKIN: Warm and dry LOWER EXTREMITIES: no swelling NEUROLOGIC:  Alert and oriented x 3 PSYCHIATRIC:  Normal affect   ASSESSMENT:    1. Syncope and collapse   2. Peripheral vascular disease, unspecified ([place])   3. Paroxysmal atrial fibrillation ([place])   4. Dyslipidemia    PLAN:    In order of problems listed above:  1. Syncope and collapse.   Story is somewhat confusing.  I suspect may be vasovagal response that is what we are dealing with.  I asked him to make sure he drink plenty of fluids.  Obviously the fact that he got remote history of questionable atrial fibrillation as well as the fact that he had nonsustained ventricular tachycardia on the monitor make it  suspicious for arrhythmic reasons.  However clinical presentation does not fit this scenario.  He usually gets nauseated before it happens last up to half an hour he does not feel any palpitations.  Sadly when he wore monitor he did not have any episodes.  I think he can benefit from referral to EP team.  He may require implantable loop recorder to check and see if he got an episode of atrial fibrillation as well as to look for any more worrisome arrhythmias.  I also think he benefit from being referred to neurologist.  He did have some neurological signs and symptoms during the episode of dizziness/near syncope last time. 2. Peripheral vascular disease and 50% carotic arterial stenosis.  Manage conservatively with Plavix aspirin and statin 80 mg Lipitor. 3. Paroxysmal atrial fibrillation 41 episode years ago since that time none.  Monitor did not show any evidence of atrial fibrillation.  His chads 2 vascular equals 1 therefore there is no compelling evidence of benefiting from anticoagulation.  Overall quite interesting and somewhat confusing story.  Records from [facility] reviewed   Medication Adjustments/Labs and Tests Ordered: Current medicines are reviewed at length with the patient today.  Concerns regarding medicines are outlined above.  No orders of the defined types were placed in this encounter.  Medication changes: No orders of the defined types were placed in this encounter.   Signed, Park Liter, MD, [facility] 01/03/2019 11:02 AM    [place]

## 2019-01-03 NOTE — Patient Instructions (Signed)
Medication Instructions:  Your physician recommends that you continue on your current medications as directed. Please refer to the Current Medication list given to you today.  If you need a refill on your cardiac medications before your next appointment, please call your pharmacy.   Lab work: None.  If you have labs (blood work) drawn today and your tests are completely normal, you will receive your results only by: Marland Kitchen MyChart Message (if you have MyChart) OR . A paper copy in the mail If you have any lab test that is abnormal or we need to change your treatment, we will call you to review the results.  Testing/Procedures: None.   Follow-Up: At [facility], you and your health needs are our priority.  As part of our continuing mission to provide you with exceptional heart care, we have created designated Provider Care Teams.  These Care Teams include your primary Cardiologist (physician) and Advanced Practice Providers (APPs -  Physician Assistants and Nurse Practitioners) who all work together to provide you with the care you need, when you need it. You will need a follow up appointment in 6 weeks.  Please call our office 2 months in advance to schedule this appointment.  You may see No primary care provider on file. or another member of our [employer] Provider Team in [place]: Shirlee More, MD . Jyl Heinz, MD  Any Other Special Instructions Will Be Listed Below (If Applicable).  Dr. Agustin Cree has referred you to electrophysiology and neurology. You should be scheduled with them within 1 week if not please call our office.

## 2019-01-03 NOTE — Addendum Note (Signed)
Addended by: Ashok Norris on: 01/03/2019 11:14 AM   Modules accepted: Orders

## 2019-01-08 DIAGNOSIS — Z6830 Body mass index (BMI) 30.0-30.9, adult: Secondary | ICD-10-CM | POA: Diagnosis not present

## 2019-01-08 DIAGNOSIS — R29818 Other symptoms and signs involving the nervous system: Secondary | ICD-10-CM | POA: Diagnosis not present

## 2019-01-08 DIAGNOSIS — R69 Illness, unspecified: Secondary | ICD-10-CM | POA: Diagnosis not present

## 2019-01-14 DIAGNOSIS — R69 Illness, unspecified: Secondary | ICD-10-CM | POA: Diagnosis not present

## 2019-01-22 DIAGNOSIS — R69 Illness, unspecified: Secondary | ICD-10-CM | POA: Diagnosis not present

## 2019-01-27 ENCOUNTER — Encounter: Payer: Self-pay | Admitting: Cardiology

## 2019-01-27 ENCOUNTER — Telehealth: Payer: Self-pay

## 2019-01-27 ENCOUNTER — Ambulatory Visit (INDEPENDENT_AMBULATORY_CARE_PROVIDER_SITE_OTHER): Payer: Medicare HMO | Admitting: Cardiology

## 2019-01-27 ENCOUNTER — Ambulatory Visit (INDEPENDENT_AMBULATORY_CARE_PROVIDER_SITE_OTHER): Payer: Medicare HMO

## 2019-01-27 ENCOUNTER — Other Ambulatory Visit: Payer: Self-pay

## 2019-01-27 ENCOUNTER — Ambulatory Visit: Payer: Medicare HMO | Admitting: Cardiology

## 2019-01-27 VITALS — BP 121/79 | HR 63 | Ht 71.0 in | Wt 219.6 lb

## 2019-01-27 DIAGNOSIS — I480 Paroxysmal atrial fibrillation: Secondary | ICD-10-CM

## 2019-01-27 DIAGNOSIS — R55 Syncope and collapse: Secondary | ICD-10-CM | POA: Diagnosis not present

## 2019-01-27 NOTE — Progress Notes (Signed)
Electrophysiology Office Note   Date:  01/27/2019   ID:  Juan Shepard, DOB [birth year], MRN [id number]  PCP:  Mateo Flow, MD  Cardiologist:  Agustin Cree Primary Electrophysiologist:  Catlyn Shipton Meredith Leeds, MD    Chief Complaint: SVT   History of Present Illness: Juan Shepard is a 68 y.o. male who is being seen today for the evaluation of SVT at the request of Jenne Campus. Presenting today for electrophysiology evaluation.  He has a history of bipolar disorder, anxiety, depression, atrial fibrillation, syncope.  He has had multiple episodes of syncope, 1 of which while sitting in a chair.  The most recent episode, he was walking in the hallway and started feeling poorly with nausea.  He went to his bedroom and laid down in the bed after feeling like he would pass out.  He had double vision and felt nauseous and tired for approximately 30 minutes.  He had a second episode after that and went to [facility].  He had a similar problem in the hospital.  He wore a cardiac monitor that did not show evidence of atrial fibrillation but had 4 beats of nonsustained VT and short episodes of SVT.  Today, he denies symptoms of palpitations, chest pain, shortness of breath, orthopnea, PND, lower extremity edema, claudication, dizziness, presyncope, syncope, bleeding, or neurologic sequela. The patient is tolerating medications without difficulties.    Past Medical History:  Diagnosis Date  . Anxiety   . Arthritis   . Bipolar disorder ([place])   . BPH (benign prostatic hyperplasia)   . Depression   . Dyslipidemia 12/12/2018  . Dysrhythmia    PAF 01-2015, saw Dr Jimmie Molly  . GERD (gastroesophageal reflux disease)   . Paroxysmal atrial fibrillation ([place]) 12/12/2018  . Peripheral vascular disease, unspecified ([place]) 12/12/2018  . Syncope and collapse 12/12/2018  . Tenosynovitis of fingers    LMF   Past Surgical History:  Procedure Laterality Date  . TENOSYNOVECTOMY Left 07/15/2015   Procedure: TENOSYNOVECTOMY LEFT FLEXORS LEFT MIDDLE FINGER;  Surgeon: Daryll Brod, MD;  Location: [place];  Service: Orthopedics;  Laterality: Left;  . TONSILLECTOMY    . VASECTOMY       Current Outpatient Medications  Medication Sig Dispense Refill  . amLODipine (NORVASC) 5 MG tablet Take 5 mg by mouth daily.    . ARIPiprazole (ABILIFY) 10 MG tablet Take 10 mg by mouth daily.     Marland Kitchen aspirin 81 MG EC tablet Take 81 mg by mouth daily.     Marland Kitchen atorvastatin (LIPITOR) 80 MG tablet Take 80 mg by mouth daily.    . celecoxib (CELEBREX) 200 MG capsule TAKE 1 CAPSULE BY MOUTH EVERY DAY FOR ARTHRITIS  3  . Cholecalciferol (VITAMIN D PO) Take by mouth.    . clopidogrel (PLAVIX) 75 MG tablet Take 75 mg by mouth daily.    . Cyanocobalamin (VITAMIN B 12 PO) Take by mouth.    . divalproex (DEPAKOTE ER) 500 MG 24 hr tablet Take 500 mg by mouth at bedtime.    Marland Kitchen FLUoxetine (PROZAC) 20 MG capsule Take 40 mg by mouth daily.   2  . LORazepam (ATIVAN) 0.5 MG tablet     . MAGNESIUM CHLORIDE ER PO Take by mouth.    . Multiple Vitamin (MULTIVITAMIN) tablet Take 1 tablet by mouth daily.    . Omega-3 Fatty Acids (FISH OIL PO) Take by mouth.     No current facility-administered medications for this visit.     Allergies:  Patient has no known allergies.   Social History:  The patient  reports that he has never smoked. He has never used smokeless tobacco. He reports that he does not drink alcohol or use drugs.   Family History:  The patient's family history includes Arthritis in his mother; Diabetes in his father; Healthy in his sister and son; Pancreatitis in his mother.    ROS:  Please see the history of present illness.   Otherwise, review of systems is positive for none.   All other systems are reviewed and negative.    PHYSICAL EXAM: VS:  BP 121/79   Pulse 63   Ht 5\' 11"  (1.803 m)   Wt 219 lb 9.6 oz (99.6 kg)   SpO2 99%   BMI 30.63 kg/m  , BMI Body mass index is 30.63 kg/m. GEN:  Well nourished, well developed, in no acute distress  HEENT: normal  Neck: no JVD, carotid bruits, or masses Cardiac: RRR; no murmurs, rubs, or gallops,no edema  Respiratory:  clear to auscultation bilaterally, normal work of breathing GI: soft, nontender, nondistended, + BS MS: no deformity or atrophy  Skin: warm and dry Neuro:  Strength and sensation are intact Psych: euthymic mood, full affect  EKG:  EKG is ordered today. Personal review of the ekg ordered shows SR, rate 63  Recent Labs: No results found for requested labs within last 8760 hours.    Lipid Panel  No results found for: CHOL, TRIG, HDL, CHOLHDL, VLDL, LDLCALC, LDLDIRECT   Wt Readings from Last 3 Encounters:  01/27/19 219 lb 9.6 oz (99.6 kg)  01/03/19 218 lb (98.9 kg)  12/12/18 219 lb 1.9 oz (99.4 kg)      Other studies Reviewed: Additional studies/ records that were reviewed today include: TTE 11/28/18  Review of the above records today demonstrates:  Ejection fraction 60 to 65% Normal LV thickness Pseudo-normal LV filling pattern Mild aortic valve sclerosis Mild mitral regurgitation  Cardiac monitor 01/01/2019 personally reviewed Minimum heart rate: 41 BPM.  Average heart rate: 56 BPM.  Maximal heart rate 169 BPM. Atrial arrhythmia: Total of 16 SVT noted longest episode 18 beats Ventricular arrhythmia: Occasional PVCs noted with 1 run of nonsustained ventricular tachycardia total of 4 beats only at rate of 169  ASSESSMENT AND PLAN:  1.  Near syncope: Episodes of syncope appear to be multifactorial.  He certainly has some orthostatic episodes, but he is also had near syncopal episodes while sitting in the chair.  He wore a 7-day monitor and did not have any episodes during that time.  He tells me that his episodes occur once every 3 or so days.  Due to that, Juan Shepard monitor him for 14 days.  If this does not show anything, Juan Shepard consider Linq monitor implant.  2.  Paroxysmal atrial fibrillation: Currently has  CHADS2VASc stroke risk is 2 with age greater than 33 and peripheral arterial disease.  He has had one episode of atrial fibrillation.  We Juan Shepard hold off on anticoagulation at this time.  We Juan Shepard plan for anticoagulation if more episodes occur.   Current medicines are reviewed at length with the patient today.   The patient does not have concerns regarding his medicines.  The following changes were made today:  none  Labs/ tests ordered today include:  Orders Placed This Encounter  Procedures  . LONG TERM MONITOR (3-14 DAYS)  . EKG 12-Lead   Case discussed with referring cardiologist  Disposition:   FU with Juan Shepard pending monitor  results  Signed, Parthenia Tellefsen Meredith Leeds, MD  01/27/2019 2:16 PM     [address] [place] [address] [phone] (office) [phone] (fax)

## 2019-01-27 NOTE — Telephone Encounter (Signed)
14 Day Zio placed on Pt during his o/v with [facility] in [place] 01/27/2019.

## 2019-01-27 NOTE — Patient Instructions (Signed)
Medication Instructions:  Your physician recommends that you continue on your current medications as directed. Please refer to the Current Medication list given to you today.  * If you need a refill on your cardiac medications before your next appointment, please call your pharmacy.   Labwork: None ordered  Testing/Procedures: Your physician has recommended that you wear a 2 week event monitor. Event monitors are medical devices that record the heart's electrical activity. Doctors most often [place] these monitors to diagnose arrhythmias. Arrhythmias are problems with the speed or rhythm of the heartbeat. The monitor is a small, portable device. You can wear one while you do your normal daily activities. This is usually used to diagnose what is causing palpitations/syncope (passing out).  Follow-Up: To be determined after monitor  Thank you for choosing [facility]!!   Trinidad Curet, RN [phone]  Any Other Special Instructions Will Be Listed Below (If Applicable).

## 2019-02-19 DIAGNOSIS — R55 Syncope and collapse: Secondary | ICD-10-CM | POA: Diagnosis not present

## 2019-02-24 DIAGNOSIS — R69 Illness, unspecified: Secondary | ICD-10-CM | POA: Diagnosis not present

## 2019-02-25 ENCOUNTER — Telehealth: Payer: Self-pay | Admitting: Cardiology

## 2019-02-25 MED ORDER — ATORVASTATIN CALCIUM 80 MG PO TABS: 80.0000 mg | ORAL_TABLET | Freq: Every day | ORAL | 1 refills | Status: AC

## 2019-02-25 NOTE — Telephone Encounter (Signed)
°*  STAT* If patient is at the pharmacy, call can be transferred to refill team.   1. Which medications need to be refilled? (please list name of each medication and dose if known) Atorvastatin 80mg  takes 1 daily   2. Which pharmacy/location (including street and city if local pharmacy) is medication to be sent to?[facility] on [address]  3. Do they need a 30 day or 90 day supply? [place]

## 2019-02-25 NOTE — Addendum Note (Signed)
Addended by: Ashok Norris on: 02/25/2019 04:51 PM   Modules accepted: Orders

## 2019-02-25 NOTE — Telephone Encounter (Signed)
Lipitor 80 mg daily refilled.

## 2019-03-03 ENCOUNTER — Ambulatory Visit: Payer: Medicare HMO | Admitting: Cardiology

## 2019-03-03 ENCOUNTER — Ambulatory Visit (INDEPENDENT_AMBULATORY_CARE_PROVIDER_SITE_OTHER): Payer: Medicare HMO | Admitting: Cardiology

## 2019-03-03 ENCOUNTER — Encounter: Payer: Self-pay | Admitting: Cardiology

## 2019-03-03 ENCOUNTER — Other Ambulatory Visit: Payer: Self-pay

## 2019-03-03 VITALS — BP 124/72 | HR 69 | Ht 71.0 in | Wt 223.8 lb

## 2019-03-03 DIAGNOSIS — I739 Peripheral vascular disease, unspecified: Secondary | ICD-10-CM | POA: Diagnosis not present

## 2019-03-03 DIAGNOSIS — I480 Paroxysmal atrial fibrillation: Secondary | ICD-10-CM

## 2019-03-03 DIAGNOSIS — E785 Hyperlipidemia, unspecified: Secondary | ICD-10-CM

## 2019-03-03 DIAGNOSIS — R55 Syncope and collapse: Secondary | ICD-10-CM

## 2019-03-03 NOTE — Progress Notes (Signed)
Cardiology Office Note:    Date:  03/03/2019   ID:  Juan Shepard, DOB 11/23/1950, MRN [id number]  PCP:  Juan Flow, MD  Cardiologist:  Juan Campus, MD    Referring MD: Juan Flow, MD   Chief Complaint  Patient presents with  . 6 week follow up  Doing very well  History of Present Illness:    Juan Shepard is a 68 y.o. male who came to me because of episode of syncope.  History was somewhat conflicting but at least a portion of it looks like potentially vasovagal.  There were also some episode of syncope that happen at rest sitting therefore concerned about potentially having some arrhythmia.  He did wear monitor which showed some runs of wide-complex tachycardia.  Eventually end up wearing second event recorder to try to see if we can catch any episode of dizziness or syncope.  I will be succeeded with this when he had episode of dizziness he did not have any arrhythmia he was in sinus rhythm.  He also got one episode of atrial fibrillation apparently documented years ago.  He is not anticoagulated and we hold off anticoagulation for now.  Today he told me that he went in front of his congregation at church and ask everybody to pray for him since that time he does not have any dizziness any passing out any palpitation he is feeling great.  Past Medical History:  Diagnosis Date  . Anxiety   . Arthritis   . Bipolar disorder ([place])   . BPH (benign prostatic hyperplasia)   . Depression   . Dyslipidemia 12/12/2018  . Dysrhythmia    PAF 01-2015, saw Dr Jimmie Molly  . GERD (gastroesophageal reflux disease)   . Paroxysmal atrial fibrillation ([place]) 12/12/2018  . Peripheral vascular disease, unspecified ([place]) 12/12/2018  . Syncope and collapse 12/12/2018  . Tenosynovitis of fingers    LMF    Past Surgical History:  Procedure Laterality Date  . TENOSYNOVECTOMY Left 07/15/2015   Procedure: TENOSYNOVECTOMY LEFT FLEXORS LEFT MIDDLE FINGER;  Surgeon: Daryll Brod, MD;  Location:  [facility];  Service: Orthopedics;  Laterality: Left;  . TONSILLECTOMY    . VASECTOMY      Current Medications: Current Meds  Medication Sig  . amLODipine (NORVASC) 5 MG tablet Take 5 mg by mouth daily.  . ARIPiprazole (ABILIFY) 10 MG tablet Take 10 mg by mouth daily.   . Ascorbic Acid (VITAMIN C) 100 MG tablet Take 100 mg by mouth daily.  Marland Kitchen aspirin 81 MG EC tablet Take 81 mg by mouth daily.   Marland Kitchen atorvastatin (LIPITOR) 80 MG tablet Take 1 tablet (80 mg total) by mouth daily.  . celecoxib (CELEBREX) 200 MG capsule TAKE 1 CAPSULE BY MOUTH EVERY DAY FOR ARTHRITIS  . Cholecalciferol (VITAMIN D PO) Take by mouth.  . clopidogrel (PLAVIX) 75 MG tablet Take 75 mg by mouth daily.  . Cyanocobalamin (VITAMIN B 12 PO) Take by mouth.  . divalproex (DEPAKOTE ER) 500 MG 24 hr tablet Take 500 mg by mouth at bedtime.  Marland Kitchen FLUoxetine (PROZAC) 20 MG capsule Take 40 mg by mouth daily.   Marland Kitchen LORazepam (ATIVAN) 0.5 MG tablet   . MAGNESIUM CHLORIDE ER PO Take by mouth.  . Multiple Vitamin (MULTIVITAMIN) tablet Take 1 tablet by mouth daily.  . Omega-3 Fatty Acids (FISH OIL PO) Take by mouth.  . zinc gluconate 50 MG tablet Take 50 mg by mouth daily.     Allergies:   Patient  has no known allergies.   Social History   Socioeconomic History  . Marital status: Married    Spouse name: Not on file  . Number of children: Not on file  . Years of education: Not on file  . Highest education level: Not on file  Occupational History  . Not on file  Social Needs  . Financial resource strain: Not on file  . Food insecurity    Worry: Not on file    Inability: Not on file  . Transportation needs    Medical: Not on file    Non-medical: Not on file  Tobacco Use  . Smoking status: Never Smoker  . Smokeless tobacco: Never Used  Substance and Sexual Activity  . Alcohol use: No  . Drug use: No  . Sexual activity: Not on file  Lifestyle  . Physical activity    Days per week: Not on file    Minutes  per session: Not on file  . Stress: Not on file  Relationships  . Social Herbalist on phone: Not on file    Gets together: Not on file    Attends religious service: Not on file    Active member of club or organization: Not on file    Attends meetings of clubs or organizations: Not on file    Relationship status: Not on file  Other Topics Concern  . Not on file  Social History Narrative  . Not on file     Family History: The patient's family history includes Arthritis in his mother; Diabetes in his father; Healthy in his sister and son; Pancreatitis in his mother. ROS:   Please see the history of present illness.    All 14 point review of systems negative except as described per history of present illness  EKGs/Labs/Other Studies Reviewed:      Recent Labs: No results found for requested labs within last 8760 hours.  Recent Lipid Panel No results found for: CHOL, TRIG, HDL, CHOLHDL, VLDL, LDLCALC, LDLDIRECT  Physical Exam:    VS:  BP 124/72   Pulse 69   Ht 5\' 11"  (1.803 m)   Wt 223 lb 12.8 oz (101.5 kg)   SpO2 96%   BMI 31.21 kg/m     Wt Readings from Last 3 Encounters:  03/03/19 223 lb 12.8 oz (101.5 kg)  01/27/19 219 lb 9.6 oz (99.6 kg)  01/03/19 218 lb (98.9 kg)     GEN:  Well nourished, well developed in no acute distress HEENT: Normal NECK: No JVD; No carotid bruits LYMPHATICS: No lymphadenopathy CARDIAC: RRR, no murmurs, no rubs, no gallops RESPIRATORY:  Clear to auscultation without rales, wheezing or rhonchi  ABDOMEN: Soft, non-tender, non-distended MUSCULOSKELETAL:  No edema; No deformity  SKIN: Warm and dry LOWER EXTREMITIES: no swelling NEUROLOGIC:  Alert and oriented x 3 PSYCHIATRIC:  Normal affect   ASSESSMENT:    1. Syncope and collapse   2. Peripheral vascular disease, unspecified ([place])   3. Paroxysmal atrial fibrillation ([place])   4. Dyslipidemia    PLAN:    In order of problems listed above:  1. Syncope and collapse  denies having any so far work-up negative.  He does not want to do anything about it.  He said he is being cured.  We did talk about potential implantable loop recorder he does not want to have it. 2. Peripheral vascular disease stable. 3. Paroxysmal atrial fibrillation only one episode many years ago without recurrences.  We will  not anticoagulate him as his chads 2 Vascor equals 1 only. 4. Dyslipidemia he is taking Lipitor 80 which I will continue.   Medication Adjustments/Labs and Tests Ordered: Current medicines are reviewed at length with the patient today.  Concerns regarding medicines are outlined above.  No orders of the defined types were placed in this encounter.  Medication changes: No orders of the defined types were placed in this encounter.   Signed, Park Liter, MD, [facility] 03/03/2019 3:31 PM    [place]

## 2019-03-03 NOTE — Patient Instructions (Addendum)

## 2019-03-07 ENCOUNTER — Telehealth: Payer: Self-pay

## 2019-03-07 NOTE — Telephone Encounter (Signed)
LMTCB

## 2019-03-07 NOTE — Telephone Encounter (Signed)
-----   Message from Juan Meredith Leeds, MD sent at 02/26/2019 10:12 AM EST ----- No obvious cause of syncope on cardiac monitor.  Patient is agreeable, would potentially plan for Linq monitor implant.

## 2019-03-25 ENCOUNTER — Other Ambulatory Visit: Payer: Self-pay | Admitting: Cardiology

## 2019-03-25 NOTE — Telephone Encounter (Signed)
°*  STAT* If patient is at the pharmacy, call can be transferred to refill team.   1. Which medications need to be refilled? (please list name of each medication and dose if known) Clopidogrel 75 mg  2. Which pharmacy/location (including street and city if local pharmacy) is medication to be sent to?[facility] on [address] [place]  3. Do they need a 30 day or 90 day supply? [place]

## 2019-03-26 NOTE — Telephone Encounter (Signed)
Pt informed of result.  He is not interested in ILR at this time.  If he has further occurrence he will have Dr. Agustin Cree refer him back to EP to discuss.

## 2019-03-27 MED ORDER — CLOPIDOGREL BISULFATE 75 MG PO TABS: 75.0000 mg | ORAL_TABLET | Freq: Every day | ORAL | 1 refills | Status: AC

## 2019-03-27 NOTE — Telephone Encounter (Signed)
Please send this refill in please

## 2019-03-28 DIAGNOSIS — Z7902 Long term (current) use of antithrombotics/antiplatelets: Secondary | ICD-10-CM | POA: Diagnosis not present

## 2019-03-28 DIAGNOSIS — M1990 Unspecified osteoarthritis, unspecified site: Secondary | ICD-10-CM | POA: Diagnosis not present

## 2019-03-28 DIAGNOSIS — I10 Essential (primary) hypertension: Secondary | ICD-10-CM | POA: Diagnosis not present

## 2019-03-28 DIAGNOSIS — R69 Illness, unspecified: Secondary | ICD-10-CM | POA: Diagnosis not present

## 2019-03-28 DIAGNOSIS — G8929 Other chronic pain: Secondary | ICD-10-CM | POA: Diagnosis not present

## 2019-03-28 DIAGNOSIS — E785 Hyperlipidemia, unspecified: Secondary | ICD-10-CM | POA: Diagnosis not present

## 2019-03-28 DIAGNOSIS — H547 Unspecified visual loss: Secondary | ICD-10-CM | POA: Diagnosis not present

## 2019-03-28 DIAGNOSIS — G219 Secondary parkinsonism, unspecified: Secondary | ICD-10-CM | POA: Diagnosis not present

## 2019-03-28 DIAGNOSIS — E669 Obesity, unspecified: Secondary | ICD-10-CM | POA: Diagnosis not present

## 2019-03-28 NOTE — Telephone Encounter (Signed)
This was refilled yesterday.

## 2019-05-28 DIAGNOSIS — R69 Illness, unspecified: Secondary | ICD-10-CM | POA: Diagnosis not present

## 2019-06-11 DIAGNOSIS — Z23 Encounter for immunization: Secondary | ICD-10-CM | POA: Diagnosis not present

## 2019-07-11 DIAGNOSIS — Z0000 Encounter for general adult medical examination without abnormal findings: Secondary | ICD-10-CM | POA: Diagnosis not present

## 2019-07-11 DIAGNOSIS — I10 Essential (primary) hypertension: Secondary | ICD-10-CM | POA: Diagnosis not present

## 2019-07-11 DIAGNOSIS — Z79899 Other long term (current) drug therapy: Secondary | ICD-10-CM | POA: Diagnosis not present

## 2019-07-11 DIAGNOSIS — Z86010 Personal history of colonic polyps: Secondary | ICD-10-CM | POA: Diagnosis not present

## 2019-07-11 DIAGNOSIS — N401 Enlarged prostate with lower urinary tract symptoms: Secondary | ICD-10-CM | POA: Diagnosis not present

## 2019-07-11 DIAGNOSIS — E782 Mixed hyperlipidemia: Secondary | ICD-10-CM | POA: Diagnosis not present

## 2019-07-11 DIAGNOSIS — Z6832 Body mass index (BMI) 32.0-32.9, adult: Secondary | ICD-10-CM | POA: Diagnosis not present

## 2019-07-11 DIAGNOSIS — I480 Paroxysmal atrial fibrillation: Secondary | ICD-10-CM | POA: Diagnosis not present

## 2019-07-11 DIAGNOSIS — Z125 Encounter for screening for malignant neoplasm of prostate: Secondary | ICD-10-CM | POA: Diagnosis not present

## 2019-07-11 DIAGNOSIS — R69 Illness, unspecified: Secondary | ICD-10-CM | POA: Diagnosis not present

## 2019-08-27 DIAGNOSIS — R69 Illness, unspecified: Secondary | ICD-10-CM | POA: Diagnosis not present

## 2019-12-03 DIAGNOSIS — R69 Illness, unspecified: Secondary | ICD-10-CM | POA: Diagnosis not present

## 2020-03-16 DIAGNOSIS — R69 Illness, unspecified: Secondary | ICD-10-CM | POA: Diagnosis not present

## 2020-04-07 DIAGNOSIS — R69 Illness, unspecified: Secondary | ICD-10-CM | POA: Diagnosis not present

## 2020-04-27 DIAGNOSIS — Z79899 Other long term (current) drug therapy: Secondary | ICD-10-CM | POA: Diagnosis not present

## 2020-04-28 DIAGNOSIS — R69 Illness, unspecified: Secondary | ICD-10-CM | POA: Diagnosis not present

## 2020-05-06 DIAGNOSIS — Z79899 Other long term (current) drug therapy: Secondary | ICD-10-CM | POA: Diagnosis not present

## 2020-05-12 DIAGNOSIS — R69 Illness, unspecified: Secondary | ICD-10-CM | POA: Diagnosis not present

## 2020-06-10 DIAGNOSIS — Z79899 Other long term (current) drug therapy: Secondary | ICD-10-CM | POA: Diagnosis not present

## 2020-06-16 DIAGNOSIS — R69 Illness, unspecified: Secondary | ICD-10-CM | POA: Diagnosis not present

## 2020-06-21 DIAGNOSIS — Z79899 Other long term (current) drug therapy: Secondary | ICD-10-CM | POA: Diagnosis not present

## 2020-06-24 DIAGNOSIS — Z6833 Body mass index (BMI) 33.0-33.9, adult: Secondary | ICD-10-CM | POA: Diagnosis not present

## 2020-06-24 DIAGNOSIS — E781 Pure hyperglyceridemia: Secondary | ICD-10-CM | POA: Diagnosis not present

## 2020-08-18 DIAGNOSIS — R69 Illness, unspecified: Secondary | ICD-10-CM | POA: Diagnosis not present

## 2020-10-07 DIAGNOSIS — Z79899 Other long term (current) drug therapy: Secondary | ICD-10-CM | POA: Diagnosis not present

## 2020-11-17 DIAGNOSIS — R69 Illness, unspecified: Secondary | ICD-10-CM | POA: Diagnosis not present

## 2021-01-25 ENCOUNTER — Encounter: Payer: Self-pay | Admitting: Gastroenterology

## 2021-02-15 DIAGNOSIS — R69 Illness, unspecified: Secondary | ICD-10-CM | POA: Diagnosis not present

## 2021-02-15 DIAGNOSIS — F3112 Bipolar disorder, current episode manic without psychotic features, moderate: Secondary | ICD-10-CM | POA: Diagnosis not present

## 2021-03-25 DIAGNOSIS — Z79899 Other long term (current) drug therapy: Secondary | ICD-10-CM | POA: Diagnosis not present

## 2021-04-22 DIAGNOSIS — Z125 Encounter for screening for malignant neoplasm of prostate: Secondary | ICD-10-CM | POA: Diagnosis not present

## 2021-04-22 DIAGNOSIS — Z6833 Body mass index (BMI) 33.0-33.9, adult: Secondary | ICD-10-CM | POA: Diagnosis not present

## 2021-04-22 DIAGNOSIS — E782 Mixed hyperlipidemia: Secondary | ICD-10-CM | POA: Diagnosis not present

## 2021-04-22 DIAGNOSIS — I480 Paroxysmal atrial fibrillation: Secondary | ICD-10-CM | POA: Diagnosis not present

## 2021-04-22 DIAGNOSIS — Z0000 Encounter for general adult medical examination without abnormal findings: Secondary | ICD-10-CM | POA: Diagnosis not present

## 2021-04-22 DIAGNOSIS — I779 Disorder of arteries and arterioles, unspecified: Secondary | ICD-10-CM | POA: Diagnosis not present

## 2021-04-22 DIAGNOSIS — R69 Illness, unspecified: Secondary | ICD-10-CM | POA: Diagnosis not present

## 2021-04-22 DIAGNOSIS — Z79899 Other long term (current) drug therapy: Secondary | ICD-10-CM | POA: Diagnosis not present

## 2021-05-14 DIAGNOSIS — J069 Acute upper respiratory infection, unspecified: Secondary | ICD-10-CM | POA: Diagnosis not present

## 2021-05-14 DIAGNOSIS — Z20828 Contact with and (suspected) exposure to other viral communicable diseases: Secondary | ICD-10-CM | POA: Diagnosis not present

## 2021-05-27 DIAGNOSIS — J209 Acute bronchitis, unspecified: Secondary | ICD-10-CM | POA: Diagnosis not present

## 2021-05-27 DIAGNOSIS — R051 Acute cough: Secondary | ICD-10-CM | POA: Diagnosis not present

## 2021-06-15 DIAGNOSIS — F3112 Bipolar disorder, current episode manic without psychotic features, moderate: Secondary | ICD-10-CM | POA: Diagnosis not present

## 2021-06-15 DIAGNOSIS — R69 Illness, unspecified: Secondary | ICD-10-CM | POA: Diagnosis not present

## 2021-07-29 DIAGNOSIS — E782 Mixed hyperlipidemia: Secondary | ICD-10-CM | POA: Diagnosis not present

## 2021-09-16 DIAGNOSIS — Z79899 Other long term (current) drug therapy: Secondary | ICD-10-CM | POA: Diagnosis not present

## 2021-09-29 DIAGNOSIS — L304 Erythema intertrigo: Secondary | ICD-10-CM | POA: Diagnosis not present

## 2021-09-29 DIAGNOSIS — L820 Inflamed seborrheic keratosis: Secondary | ICD-10-CM | POA: Diagnosis not present

## 2021-10-31 DIAGNOSIS — B079 Viral wart, unspecified: Secondary | ICD-10-CM | POA: Diagnosis not present

## 2021-10-31 DIAGNOSIS — D485 Neoplasm of uncertain behavior of skin: Secondary | ICD-10-CM | POA: Diagnosis not present

## 2021-10-31 DIAGNOSIS — L400 Psoriasis vulgaris: Secondary | ICD-10-CM | POA: Diagnosis not present

## 2021-10-31 DIAGNOSIS — L409 Psoriasis, unspecified: Secondary | ICD-10-CM | POA: Diagnosis not present

## 2021-12-06 DIAGNOSIS — C44212 Basal cell carcinoma of skin of right ear and external auricular canal: Secondary | ICD-10-CM | POA: Diagnosis not present

## 2021-12-20 DIAGNOSIS — F3112 Bipolar disorder, current episode manic without psychotic features, moderate: Secondary | ICD-10-CM | POA: Diagnosis not present

## 2021-12-20 DIAGNOSIS — R69 Illness, unspecified: Secondary | ICD-10-CM | POA: Diagnosis not present

## 2022-01-03 DIAGNOSIS — C44212 Basal cell carcinoma of skin of right ear and external auricular canal: Secondary | ICD-10-CM | POA: Diagnosis not present

## 2022-01-03 DIAGNOSIS — L300 Nummular dermatitis: Secondary | ICD-10-CM | POA: Diagnosis not present

## 2022-03-15 DIAGNOSIS — R197 Diarrhea, unspecified: Secondary | ICD-10-CM | POA: Diagnosis not present

## 2022-04-13 DIAGNOSIS — J029 Acute pharyngitis, unspecified: Secondary | ICD-10-CM | POA: Diagnosis not present

## 2022-04-13 DIAGNOSIS — R509 Fever, unspecified: Secondary | ICD-10-CM | POA: Diagnosis not present

## 2022-04-13 DIAGNOSIS — H9201 Otalgia, right ear: Secondary | ICD-10-CM | POA: Diagnosis not present

## 2022-05-02 DIAGNOSIS — L219 Seborrheic dermatitis, unspecified: Secondary | ICD-10-CM | POA: Diagnosis not present

## 2022-06-27 DIAGNOSIS — F3112 Bipolar disorder, current episode manic without psychotic features, moderate: Secondary | ICD-10-CM | POA: Diagnosis not present

## 2022-06-27 DIAGNOSIS — R69 Illness, unspecified: Secondary | ICD-10-CM | POA: Diagnosis not present

## 2022-07-25 DIAGNOSIS — M25461 Effusion, right knee: Secondary | ICD-10-CM | POA: Diagnosis not present

## 2022-07-25 DIAGNOSIS — M25561 Pain in right knee: Secondary | ICD-10-CM | POA: Diagnosis not present

## 2022-08-01 DIAGNOSIS — M25461 Effusion, right knee: Secondary | ICD-10-CM | POA: Diagnosis not present

## 2022-08-01 DIAGNOSIS — M25561 Pain in right knee: Secondary | ICD-10-CM | POA: Diagnosis not present

## 2022-08-08 DIAGNOSIS — M25461 Effusion, right knee: Secondary | ICD-10-CM | POA: Diagnosis not present

## 2022-08-08 DIAGNOSIS — S83281A Other tear of lateral meniscus, current injury, right knee, initial encounter: Secondary | ICD-10-CM | POA: Diagnosis not present

## 2022-08-08 DIAGNOSIS — M25561 Pain in right knee: Secondary | ICD-10-CM | POA: Diagnosis not present

## 2022-08-08 DIAGNOSIS — S83241A Other tear of medial meniscus, current injury, right knee, initial encounter: Secondary | ICD-10-CM | POA: Diagnosis not present

## 2022-08-17 DIAGNOSIS — Z7984 Long term (current) use of oral hypoglycemic drugs: Secondary | ICD-10-CM | POA: Diagnosis not present

## 2022-08-17 DIAGNOSIS — F319 Bipolar disorder, unspecified: Secondary | ICD-10-CM | POA: Diagnosis not present

## 2022-08-17 DIAGNOSIS — S83231A Complex tear of medial meniscus, current injury, right knee, initial encounter: Secondary | ICD-10-CM | POA: Diagnosis not present

## 2022-08-17 DIAGNOSIS — K219 Gastro-esophageal reflux disease without esophagitis: Secondary | ICD-10-CM | POA: Diagnosis not present

## 2022-08-17 DIAGNOSIS — S83281A Other tear of lateral meniscus, current injury, right knee, initial encounter: Secondary | ICD-10-CM | POA: Diagnosis not present

## 2022-08-17 DIAGNOSIS — Y9369 Activity, other involving other sports and athletics played as a team or group: Secondary | ICD-10-CM | POA: Diagnosis not present

## 2022-08-17 DIAGNOSIS — Z791 Long term (current) use of non-steroidal anti-inflammatories (NSAID): Secondary | ICD-10-CM | POA: Diagnosis not present

## 2022-08-17 DIAGNOSIS — S83241A Other tear of medial meniscus, current injury, right knee, initial encounter: Secondary | ICD-10-CM | POA: Diagnosis not present

## 2022-08-17 DIAGNOSIS — M23251 Derangement of posterior horn of lateral meniscus due to old tear or injury, right knee: Secondary | ICD-10-CM | POA: Diagnosis not present

## 2022-08-17 DIAGNOSIS — I4891 Unspecified atrial fibrillation: Secondary | ICD-10-CM | POA: Diagnosis not present

## 2022-08-17 DIAGNOSIS — E785 Hyperlipidemia, unspecified: Secondary | ICD-10-CM | POA: Diagnosis not present

## 2022-08-17 DIAGNOSIS — M65861 Other synovitis and tenosynovitis, right lower leg: Secondary | ICD-10-CM | POA: Diagnosis not present

## 2022-08-17 DIAGNOSIS — M1711 Unilateral primary osteoarthritis, right knee: Secondary | ICD-10-CM | POA: Diagnosis not present

## 2022-08-21 DIAGNOSIS — M25461 Effusion, right knee: Secondary | ICD-10-CM | POA: Diagnosis not present

## 2022-08-21 DIAGNOSIS — M25661 Stiffness of right knee, not elsewhere classified: Secondary | ICD-10-CM | POA: Diagnosis not present

## 2022-08-21 DIAGNOSIS — M25561 Pain in right knee: Secondary | ICD-10-CM | POA: Diagnosis not present

## 2022-08-21 DIAGNOSIS — R2689 Other abnormalities of gait and mobility: Secondary | ICD-10-CM | POA: Diagnosis not present

## 2022-08-24 DIAGNOSIS — R2689 Other abnormalities of gait and mobility: Secondary | ICD-10-CM | POA: Diagnosis not present

## 2022-08-24 DIAGNOSIS — M25461 Effusion, right knee: Secondary | ICD-10-CM | POA: Diagnosis not present

## 2022-08-24 DIAGNOSIS — M25661 Stiffness of right knee, not elsewhere classified: Secondary | ICD-10-CM | POA: Diagnosis not present

## 2022-08-24 DIAGNOSIS — M25561 Pain in right knee: Secondary | ICD-10-CM | POA: Diagnosis not present

## 2022-08-28 DIAGNOSIS — M25461 Effusion, right knee: Secondary | ICD-10-CM | POA: Diagnosis not present

## 2022-08-28 DIAGNOSIS — M25661 Stiffness of right knee, not elsewhere classified: Secondary | ICD-10-CM | POA: Diagnosis not present

## 2022-08-28 DIAGNOSIS — R2689 Other abnormalities of gait and mobility: Secondary | ICD-10-CM | POA: Diagnosis not present

## 2022-08-28 DIAGNOSIS — M25561 Pain in right knee: Secondary | ICD-10-CM | POA: Diagnosis not present

## 2022-08-31 DIAGNOSIS — M25661 Stiffness of right knee, not elsewhere classified: Secondary | ICD-10-CM | POA: Diagnosis not present

## 2022-08-31 DIAGNOSIS — R2689 Other abnormalities of gait and mobility: Secondary | ICD-10-CM | POA: Diagnosis not present

## 2022-08-31 DIAGNOSIS — M25561 Pain in right knee: Secondary | ICD-10-CM | POA: Diagnosis not present

## 2022-08-31 DIAGNOSIS — M25461 Effusion, right knee: Secondary | ICD-10-CM | POA: Diagnosis not present

## 2022-09-04 DIAGNOSIS — R2689 Other abnormalities of gait and mobility: Secondary | ICD-10-CM | POA: Diagnosis not present

## 2022-09-04 DIAGNOSIS — M25561 Pain in right knee: Secondary | ICD-10-CM | POA: Diagnosis not present

## 2022-09-04 DIAGNOSIS — M25461 Effusion, right knee: Secondary | ICD-10-CM | POA: Diagnosis not present

## 2022-09-04 DIAGNOSIS — M25661 Stiffness of right knee, not elsewhere classified: Secondary | ICD-10-CM | POA: Diagnosis not present

## 2022-09-06 DIAGNOSIS — M25561 Pain in right knee: Secondary | ICD-10-CM | POA: Diagnosis not present

## 2022-09-06 DIAGNOSIS — M25661 Stiffness of right knee, not elsewhere classified: Secondary | ICD-10-CM | POA: Diagnosis not present

## 2022-09-06 DIAGNOSIS — R2689 Other abnormalities of gait and mobility: Secondary | ICD-10-CM | POA: Diagnosis not present

## 2022-09-06 DIAGNOSIS — M25461 Effusion, right knee: Secondary | ICD-10-CM | POA: Diagnosis not present

## 2022-09-12 DIAGNOSIS — R2689 Other abnormalities of gait and mobility: Secondary | ICD-10-CM | POA: Diagnosis not present

## 2022-09-12 DIAGNOSIS — M25661 Stiffness of right knee, not elsewhere classified: Secondary | ICD-10-CM | POA: Diagnosis not present

## 2022-09-12 DIAGNOSIS — M25561 Pain in right knee: Secondary | ICD-10-CM | POA: Diagnosis not present

## 2022-09-12 DIAGNOSIS — M25461 Effusion, right knee: Secondary | ICD-10-CM | POA: Diagnosis not present

## 2022-09-14 DIAGNOSIS — M25461 Effusion, right knee: Secondary | ICD-10-CM | POA: Diagnosis not present

## 2022-09-14 DIAGNOSIS — R2689 Other abnormalities of gait and mobility: Secondary | ICD-10-CM | POA: Diagnosis not present

## 2022-09-14 DIAGNOSIS — M25561 Pain in right knee: Secondary | ICD-10-CM | POA: Diagnosis not present

## 2022-09-14 DIAGNOSIS — M25661 Stiffness of right knee, not elsewhere classified: Secondary | ICD-10-CM | POA: Diagnosis not present

## 2022-09-18 DIAGNOSIS — R2689 Other abnormalities of gait and mobility: Secondary | ICD-10-CM | POA: Diagnosis not present

## 2022-09-18 DIAGNOSIS — M25561 Pain in right knee: Secondary | ICD-10-CM | POA: Diagnosis not present

## 2022-09-18 DIAGNOSIS — M25661 Stiffness of right knee, not elsewhere classified: Secondary | ICD-10-CM | POA: Diagnosis not present

## 2022-09-18 DIAGNOSIS — M25461 Effusion, right knee: Secondary | ICD-10-CM | POA: Diagnosis not present

## 2022-09-19 DIAGNOSIS — Z79899 Other long term (current) drug therapy: Secondary | ICD-10-CM | POA: Diagnosis not present

## 2022-09-21 DIAGNOSIS — M25561 Pain in right knee: Secondary | ICD-10-CM | POA: Diagnosis not present

## 2022-09-21 DIAGNOSIS — M25661 Stiffness of right knee, not elsewhere classified: Secondary | ICD-10-CM | POA: Diagnosis not present

## 2022-09-21 DIAGNOSIS — R2689 Other abnormalities of gait and mobility: Secondary | ICD-10-CM | POA: Diagnosis not present

## 2022-09-21 DIAGNOSIS — M25461 Effusion, right knee: Secondary | ICD-10-CM | POA: Diagnosis not present

## 2022-09-25 DIAGNOSIS — M25661 Stiffness of right knee, not elsewhere classified: Secondary | ICD-10-CM | POA: Diagnosis not present

## 2022-09-25 DIAGNOSIS — M25461 Effusion, right knee: Secondary | ICD-10-CM | POA: Diagnosis not present

## 2022-09-25 DIAGNOSIS — R2689 Other abnormalities of gait and mobility: Secondary | ICD-10-CM | POA: Diagnosis not present

## 2022-09-25 DIAGNOSIS — M25561 Pain in right knee: Secondary | ICD-10-CM | POA: Diagnosis not present

## 2022-09-27 DIAGNOSIS — R2689 Other abnormalities of gait and mobility: Secondary | ICD-10-CM | POA: Diagnosis not present

## 2022-09-27 DIAGNOSIS — M25561 Pain in right knee: Secondary | ICD-10-CM | POA: Diagnosis not present

## 2022-09-27 DIAGNOSIS — M25661 Stiffness of right knee, not elsewhere classified: Secondary | ICD-10-CM | POA: Diagnosis not present

## 2022-09-27 DIAGNOSIS — M25461 Effusion, right knee: Secondary | ICD-10-CM | POA: Diagnosis not present

## 2022-10-04 DIAGNOSIS — M25561 Pain in right knee: Secondary | ICD-10-CM | POA: Diagnosis not present

## 2022-10-04 DIAGNOSIS — M25661 Stiffness of right knee, not elsewhere classified: Secondary | ICD-10-CM | POA: Diagnosis not present

## 2022-10-04 DIAGNOSIS — R2689 Other abnormalities of gait and mobility: Secondary | ICD-10-CM | POA: Diagnosis not present

## 2022-10-04 DIAGNOSIS — M25461 Effusion, right knee: Secondary | ICD-10-CM | POA: Diagnosis not present

## 2022-10-06 DIAGNOSIS — M25661 Stiffness of right knee, not elsewhere classified: Secondary | ICD-10-CM | POA: Diagnosis not present

## 2022-10-06 DIAGNOSIS — R2689 Other abnormalities of gait and mobility: Secondary | ICD-10-CM | POA: Diagnosis not present

## 2022-10-06 DIAGNOSIS — M25561 Pain in right knee: Secondary | ICD-10-CM | POA: Diagnosis not present

## 2022-10-06 DIAGNOSIS — M25461 Effusion, right knee: Secondary | ICD-10-CM | POA: Diagnosis not present

## 2022-10-10 DIAGNOSIS — L820 Inflamed seborrheic keratosis: Secondary | ICD-10-CM | POA: Diagnosis not present

## 2022-12-12 DIAGNOSIS — F3112 Bipolar disorder, current episode manic without psychotic features, moderate: Secondary | ICD-10-CM | POA: Diagnosis not present

## 2023-03-01 DIAGNOSIS — I480 Paroxysmal atrial fibrillation: Secondary | ICD-10-CM | POA: Diagnosis not present

## 2023-03-01 DIAGNOSIS — Z2821 Immunization not carried out because of patient refusal: Secondary | ICD-10-CM | POA: Diagnosis not present

## 2023-03-01 DIAGNOSIS — Z1339 Encounter for screening examination for other mental health and behavioral disorders: Secondary | ICD-10-CM | POA: Diagnosis not present

## 2023-03-01 DIAGNOSIS — Z1331 Encounter for screening for depression: Secondary | ICD-10-CM | POA: Diagnosis not present

## 2023-03-01 DIAGNOSIS — F319 Bipolar disorder, unspecified: Secondary | ICD-10-CM | POA: Diagnosis not present

## 2023-03-01 DIAGNOSIS — Z9181 History of falling: Secondary | ICD-10-CM | POA: Diagnosis not present

## 2023-03-01 DIAGNOSIS — E782 Mixed hyperlipidemia: Secondary | ICD-10-CM | POA: Diagnosis not present

## 2023-03-01 DIAGNOSIS — Z6835 Body mass index (BMI) 35.0-35.9, adult: Secondary | ICD-10-CM | POA: Diagnosis not present

## 2023-03-01 DIAGNOSIS — Z0000 Encounter for general adult medical examination without abnormal findings: Secondary | ICD-10-CM | POA: Diagnosis not present

## 2023-03-01 DIAGNOSIS — K429 Umbilical hernia without obstruction or gangrene: Secondary | ICD-10-CM | POA: Diagnosis not present

## 2023-05-10 DIAGNOSIS — L600 Ingrowing nail: Secondary | ICD-10-CM | POA: Diagnosis not present

## 2023-05-10 DIAGNOSIS — E782 Mixed hyperlipidemia: Secondary | ICD-10-CM | POA: Diagnosis not present

## 2023-05-10 DIAGNOSIS — Z79899 Other long term (current) drug therapy: Secondary | ICD-10-CM | POA: Diagnosis not present

## 2023-05-15 DIAGNOSIS — R059 Cough, unspecified: Secondary | ICD-10-CM | POA: Diagnosis not present

## 2023-05-15 DIAGNOSIS — R509 Fever, unspecified: Secondary | ICD-10-CM | POA: Diagnosis not present

## 2023-05-15 DIAGNOSIS — R5383 Other fatigue: Secondary | ICD-10-CM | POA: Diagnosis not present

## 2023-05-15 DIAGNOSIS — R0981 Nasal congestion: Secondary | ICD-10-CM | POA: Diagnosis not present

## 2023-05-22 DIAGNOSIS — F3112 Bipolar disorder, current episode manic without psychotic features, moderate: Secondary | ICD-10-CM | POA: Diagnosis not present

## 2023-06-11 DIAGNOSIS — L600 Ingrowing nail: Secondary | ICD-10-CM | POA: Diagnosis not present

## 2023-06-11 DIAGNOSIS — B351 Tinea unguium: Secondary | ICD-10-CM | POA: Diagnosis not present

## 2023-08-21 DIAGNOSIS — Z79899 Other long term (current) drug therapy: Secondary | ICD-10-CM | POA: Diagnosis not present

## 2023-08-21 DIAGNOSIS — F3112 Bipolar disorder, current episode manic without psychotic features, moderate: Secondary | ICD-10-CM | POA: Diagnosis not present

## 2023-09-03 DIAGNOSIS — S60450A Superficial foreign body of right index finger, initial encounter: Secondary | ICD-10-CM | POA: Diagnosis not present

## 2023-10-18 DIAGNOSIS — Z6835 Body mass index (BMI) 35.0-35.9, adult: Secondary | ICD-10-CM | POA: Diagnosis not present

## 2023-10-18 DIAGNOSIS — K429 Umbilical hernia without obstruction or gangrene: Secondary | ICD-10-CM | POA: Diagnosis not present

## 2023-10-18 DIAGNOSIS — R7401 Elevation of levels of liver transaminase levels: Secondary | ICD-10-CM | POA: Diagnosis not present

## 2023-10-18 DIAGNOSIS — M6208 Separation of muscle (nontraumatic), other site: Secondary | ICD-10-CM | POA: Diagnosis not present

## 2023-10-24 DIAGNOSIS — K429 Umbilical hernia without obstruction or gangrene: Secondary | ICD-10-CM | POA: Diagnosis not present

## 2023-10-24 DIAGNOSIS — K8020 Calculus of gallbladder without cholecystitis without obstruction: Secondary | ICD-10-CM | POA: Diagnosis not present

## 2023-10-24 DIAGNOSIS — N281 Cyst of kidney, acquired: Secondary | ICD-10-CM | POA: Diagnosis not present

## 2023-10-30 DIAGNOSIS — K429 Umbilical hernia without obstruction or gangrene: Secondary | ICD-10-CM | POA: Diagnosis not present

## 2023-11-20 DIAGNOSIS — F3112 Bipolar disorder, current episode manic without psychotic features, moderate: Secondary | ICD-10-CM | POA: Diagnosis not present

## 2023-12-06 DIAGNOSIS — Z79899 Other long term (current) drug therapy: Secondary | ICD-10-CM | POA: Diagnosis not present

## 2023-12-06 DIAGNOSIS — F3112 Bipolar disorder, current episode manic without psychotic features, moderate: Secondary | ICD-10-CM | POA: Diagnosis not present

## 2023-12-19 DIAGNOSIS — F3112 Bipolar disorder, current episode manic without psychotic features, moderate: Secondary | ICD-10-CM | POA: Diagnosis not present

## 2024-06-23 ENCOUNTER — Ambulatory Visit: Payer: Self-pay | Admitting: Neurology
